# Patient Record
Sex: Female | Born: 1980 | Race: White | Hispanic: Yes | Marital: Married | Smoking: Current some day smoker
Health system: Southern US, Community
[De-identification: ages and names within clinical notes are randomized; demographics above are authoritative.]

---

## 2000-02-10 ENCOUNTER — Encounter (HOSPITAL_COMMUNITY): Admission: RE | Admit: 2000-02-10 | Discharge: 2000-02-27 | Payer: Self-pay | Admitting: *Deleted

## 2000-02-26 ENCOUNTER — Inpatient Hospital Stay (HOSPITAL_COMMUNITY): Admission: AD | Admit: 2000-02-26 | Discharge: 2000-02-28 | Payer: Self-pay | Admitting: *Deleted

## 2003-07-16 ENCOUNTER — Other Ambulatory Visit: Admission: RE | Admit: 2003-07-16 | Discharge: 2003-07-16 | Payer: Self-pay | Admitting: Dermatology

## 2005-02-05 ENCOUNTER — Ambulatory Visit: Payer: Self-pay | Admitting: Family Medicine

## 2005-02-10 ENCOUNTER — Ambulatory Visit: Payer: Self-pay | Admitting: *Deleted

## 2005-02-17 ENCOUNTER — Ambulatory Visit: Payer: Self-pay | Admitting: *Deleted

## 2005-02-24 ENCOUNTER — Ambulatory Visit: Payer: Self-pay | Admitting: *Deleted

## 2005-02-25 ENCOUNTER — Ambulatory Visit: Payer: Self-pay | Admitting: Certified Nurse Midwife

## 2005-02-25 ENCOUNTER — Inpatient Hospital Stay (HOSPITAL_COMMUNITY): Admission: AD | Admit: 2005-02-25 | Discharge: 2005-02-27 | Payer: Self-pay | Admitting: Obstetrics

## 2005-02-25 ENCOUNTER — Inpatient Hospital Stay (HOSPITAL_COMMUNITY): Admission: AD | Admit: 2005-02-25 | Discharge: 2005-02-25 | Payer: Self-pay | Admitting: *Deleted

## 2009-01-15 ENCOUNTER — Other Ambulatory Visit: Admission: RE | Admit: 2009-01-15 | Discharge: 2009-01-15 | Payer: Self-pay | Admitting: Unknown Physician Specialty

## 2009-01-15 ENCOUNTER — Encounter (INDEPENDENT_AMBULATORY_CARE_PROVIDER_SITE_OTHER): Payer: Self-pay | Admitting: Unknown Physician Specialty

## 2010-02-18 ENCOUNTER — Other Ambulatory Visit: Admission: RE | Admit: 2010-02-18 | Discharge: 2010-02-18 | Payer: Self-pay | Admitting: Unknown Physician Specialty

## 2011-01-27 ENCOUNTER — Other Ambulatory Visit: Payer: Self-pay | Admitting: Nurse Practitioner

## 2011-01-27 ENCOUNTER — Other Ambulatory Visit (HOSPITAL_COMMUNITY)
Admission: RE | Admit: 2011-01-27 | Discharge: 2011-01-27 | Disposition: A | Discharge status: Home / Self Care | Payer: Self-pay | Source: Ambulatory Visit | Attending: Unknown Physician Specialty | Admitting: Unknown Physician Specialty

## 2011-01-27 DIAGNOSIS — N871 Moderate cervical dysplasia: Secondary | ICD-10-CM | POA: Insufficient documentation

## 2013-03-17 ENCOUNTER — Other Ambulatory Visit: Payer: Self-pay | Admitting: Obstetrics & Gynecology

## 2013-03-17 DIAGNOSIS — O3680X0 Pregnancy with inconclusive fetal viability, not applicable or unspecified: Secondary | ICD-10-CM

## 2013-03-21 ENCOUNTER — Ambulatory Visit (INDEPENDENT_AMBULATORY_CARE_PROVIDER_SITE_OTHER): Payer: Self-pay

## 2013-03-21 DIAGNOSIS — O3680X0 Pregnancy with inconclusive fetal viability, not applicable or unspecified: Secondary | ICD-10-CM

## 2013-03-21 NOTE — Progress Notes (Signed)
Single, living, iup, meas. C/w dates.cx long and closed, bilat ovs seen, lo w/ c.l., ys nml size/shape, +FHT.

## 2013-03-28 ENCOUNTER — Other Ambulatory Visit (HOSPITAL_COMMUNITY)
Admission: RE | Admit: 2013-03-28 | Discharge: 2013-03-28 | Disposition: A | Discharge status: Home / Self Care | Payer: Self-pay | Source: Ambulatory Visit | Attending: Obstetrics & Gynecology | Admitting: Obstetrics & Gynecology

## 2013-03-28 ENCOUNTER — Ambulatory Visit (INDEPENDENT_AMBULATORY_CARE_PROVIDER_SITE_OTHER): Payer: Self-pay | Admitting: Women's Health

## 2013-03-28 ENCOUNTER — Encounter: Payer: Self-pay | Admitting: Women's Health

## 2013-03-28 VITALS — BP 102/64 | Ht 61.0 in | Wt 152.2 lb

## 2013-03-28 DIAGNOSIS — Z01419 Encounter for gynecological examination (general) (routine) without abnormal findings: Secondary | ICD-10-CM | POA: Insufficient documentation

## 2013-03-28 DIAGNOSIS — O34219 Maternal care for unspecified type scar from previous cesarean delivery: Secondary | ICD-10-CM

## 2013-03-28 DIAGNOSIS — Z331 Pregnant state, incidental: Secondary | ICD-10-CM

## 2013-03-28 DIAGNOSIS — Z3401 Encounter for supervision of normal first pregnancy, first trimester: Secondary | ICD-10-CM

## 2013-03-28 DIAGNOSIS — Z98891 History of uterine scar from previous surgery: Secondary | ICD-10-CM | POA: Insufficient documentation

## 2013-03-28 DIAGNOSIS — Z1389 Encounter for screening for other disorder: Secondary | ICD-10-CM

## 2013-03-28 DIAGNOSIS — Z113 Encounter for screening for infections with a predominantly sexual mode of transmission: Secondary | ICD-10-CM | POA: Insufficient documentation

## 2013-03-28 DIAGNOSIS — Z1151 Encounter for screening for human papillomavirus (HPV): Secondary | ICD-10-CM | POA: Insufficient documentation

## 2013-03-28 DIAGNOSIS — Z3480 Encounter for supervision of other normal pregnancy, unspecified trimester: Secondary | ICD-10-CM | POA: Insufficient documentation

## 2013-03-28 DIAGNOSIS — O3440 Maternal care for other abnormalities of cervix, unspecified trimester: Secondary | ICD-10-CM

## 2013-03-28 LAB — POCT URINALYSIS DIPSTICK
Glucose, UA: NEGATIVE
Leukocytes, UA: NEGATIVE
Protein, UA: NEGATIVE

## 2013-03-28 LAB — CBC
Hemoglobin: 12.6 g/dL (ref 12.0–15.0)
MCH: 32.6 pg (ref 26.0–34.0)
Platelets: 350 10*3/uL (ref 150–400)
RBC: 3.86 MIL/uL — ABNORMAL LOW (ref 3.87–5.11)
WBC: 10.8 10*3/uL — ABNORMAL HIGH (ref 4.0–10.5)

## 2013-03-28 NOTE — Patient Instructions (Addendum)
Embarazo  Primer trimestre  (Pregnancy - First Trimester)  Durante el acto sexual, millones de espermatozoides entran en la vagina. Slo 1 espermatozoide penetra y fertiliza al vulo mientras se encuentra en la trompa de [place]. Una semana ms tarde, el vulo fertilizado se implanta en la pared del tero. Un embrin comienza a desarrollarse para ser un beb. A las 6 a 8 semanas se forman los ojos y la cara y los latidos del corazn se pueden ver en la ecografa. Al final de las 12 semanas (primer trimestre) todos los rganos del beb estn formados. Ahora que est embarazada, querr hacer todo lo que est a su alcance para tener un beb sano. Dos de las cosas ms importantes son: tener una buena atencin prenatal y seguir las indicaciones del profesional que la asiste. La atencin prenatal incluye toda la asistencia mdica que usted recibe antes del nacimiento del beb. Se lleva a cabo para prevenir y tratar problemas durante el embarazo y el parto. EXAMENES PRENATALES   Durante las visitas prenatales se controlan el peso, la presin arterial y se solicitan anlisis de orina. Esto se hace para asegurarse de que usted est sana y el embarazo progrese normalmente.  Una mujer embarazada debe aumentar de 25 a 35 libras durante el embarazo. Sin embargo, si usted tiene sobrepeso o bajo peso, su mdico le aconsejar qu hacer.  El podr hacerle preguntas y responder todas las que usted le haga.  Durante los exmenes prenatales se solicitan anlisis de sangre, cultivos del tero, un [place] y otros anlisis necesarios. Estas pruebas se realizan para controlar su salud y la del beb. Se recomienda que se haga la prueba para el diagnstico del VIH, con su autorizacin. Este es el virus que causa el SIDA. Estas pruebas se realizan porque existen medicamentos que podran administrarle para prevenir que el beb nazca con esta infeccin, si usted estuviera infectada y no lo supiera. Los anlisis de sangre  tambin se realizan para determinar el tipo de sangre, si tuvo infecciones previas y controlar sus niveles en la sangre (hemoglobina).  Tener un recuento bajo de hemoglobina (anemia) es comn durante el embarazo. Para prevenirla, se administran hierro y vitaminas. En una etapa ms avanzada del embarazo, le indicarn exmenes de sangre para saber si tiene diabetes, junto con otros anlisis, en caso de que tuviera problemas.  Es necesario que se haga las pruebas para asegurarse de que usted y el beb estn bien. CAMBIOS DURANTE EL PRIMER TRIMESTRE  Su organismo atravesar numerosos cambios durante el embarazo. Estos pueden variar de una persona a otra. Converse con el mdico acerca los cambios que usted nota y que la preocupan. Ellos son:   El perodo menstrual se detiene.  El vulo y los espermatozoides llevan los genes que determinan cmo seremos. Sus genes y los de su pareja forman el beb. Los genes del varn determinan si ser un nio o una nia.  La circunferencia de la cintura va a ir aumentando y podr sentirse hinchada.  Puede tener malestar estomacal (nuseas) y vmitos. Si no puede controlar los vmitos, consulte a su mdico.  Sus mamas comenzarn a agrandarse y sensibilizarse.  Los pezones pueden sobresalir ms y ser ms oscuros.  Tendr necesidad de orinar ms. El dolor al orinar puede significar que usted tiene una infeccin de la vejiga.  Se cansar con facilidad.  Prdida del apetito.  Sentir un fuerte deseo de consumir ciertos alimentos.  Al principio, usted puede ganar o perder un par de kilos.    Podr tener cambios emocionales de un da a otro (entusiasmo por estar embarazada o preocupacin por el embarazo y el beb).  Tendr sueos ms vvidos y extraos. INSTRUCCIONES PARA EL CUIDADO EN EL HOGAR   Es muy importante evitar el cigarrillo, el alcohol y los frmacos no recetados durante el embarazo. Estas sustancias afectan la formacin y el desarrollo del beb.  Evite los productos qumicos durante el embarazo para asegurar la salud del beb.  Comience las consultas prenatales alrededor de la 12 semana de embarazo. Generalmente se programan cada mes al principio y se hacen ms frecuentes en los 2 ltimos meses antes del parto. Cumpla con las citas de control. Siga las indicaciones del mdico con respecto al uso de medicamentos, los anlisis y pruebas de laboratorio, los ejercicios y la dieta.  Durante el embarazo debe obtener nutrientes para usted y para su beb. Consuma alimentos balanceados. Elija alimentos como carne, pescado, leche y otros productos lcteos descremados, vegetales, frutas, panes integrales y cereales. El mdico le informar cul es el aumento de peso ideal.  Las nuseas matinales pueden aliviarse si come algunas galletitas saladas en la cama. Coma dos galletitas antes de levantarse por la maana. Tambin puede comer galletitas sin sal.  Hacer 4 o 5 comidas pequeas en lugar de 3 comidas grandes por da tambin puede aliviar las nuseas y los vmitos.  Beber lquidos entre las comidas en lugar de tomarlos durante las comidas tambin puede ayudar a calmar las nuseas y los vmitos.  Puede continuar teniendo relaciones sexuales durante todo el embarazo si no hay otros problemas. Los problemas pueden ser una prdida precoz (prematura) de lquido amnitico, sangrado vaginal, o dolor en el vientre (abdominal).  Realice actividad fsica todos los das, si no tiene restricciones. Consulte con su mdico o terapeuta fsico si no est segura de algunos de sus ejercicios. El mayor aumento de peso se producir en los ltimos 2 trimestres del embarazo. El ejercicio le ayudar a:  Controlar su peso.  Mantenerse en forma.  Prepararse para el parto.  La ayudar a perder el peso del embarazo despus de que nazca su beb.  Use un buen sostn o como los que se usan para hacer deportes para aliviar la sensibilidad de las mamas. Tambin puede serle  til si lo usa mientras duerme.  Consulte cuando puede comenzar con las clases de pre parto. Comience con las clases cuando estn disponibles.  No utilice la baera con agua caliente, baos turcos y saunas.   Colquese el cinturn de seguridad cuando conduzca. Este la proteger a usted y al beb en caso de accidente.  Evite comer carne cruda y el contacto con los utensilios y desperdicios de los gatos. Estos elementos contienen grmenes que pueden causar defectos de nacimiento en el beb.  El primer trimestre es un buen momento para visitar a su dentista y evaluar su salud dental. Es importante mantener los dientes limpios. Use un cepillo de dientes suave y cepllese con ms suavidad durante el embarazo.  Pida ayuda si tienen necesidades financieras, teraputicas o nutricionales. El profesional podr ayudarla con respecto a estas necesidades, o derivarla a otros especialistas.  No tome medicamentos o hierbas excepto aquellos que le indic el profesional.  Informe a su mdico si sufre violencia familiar mental o fsica.  Haga una lista de nmeros de telfono de emergencia de la familia, los amigos, el hospital y los departamentos de polica y bomberos.  Escriba sus preguntas. Llvelas cuando concurra a su visita prenatal.  No se   haga duchas vaginales.  No cruce las piernas.  Si usted tiene que estar parada por largos perodos de tiempo, gire los pies o de pequeos pasos en crculo.  Es posible que tenga ms secreciones vaginales que puedan requerir una toalla higinica. No use tampones o toallas higinicas perfumadas. EL CONSUMO DE MEDICAMENTOS Y FRMACOS DURANTE EL EMBARAZO   Tome las vitaminas para la etapa prenatal tal como se le indic. Las vitaminas deben contener un miligramo de cido flico. Guarde todas las vitaminas fuera del alcance de los nios. La ingestin de slo un par de vitaminas o tabletas que contengan hierro pueden ocasionar la muerte en un beb o en un nio  pequeo.  Evite el uso de todos los medicamentos, incluyendo hierbas, medicamentos de venta libre, sin receta o que no hayan sido sugeridos por su mdico. Slo tome medicamentos de venta libre o medicamentos recetados para el dolor, el malestar o fiebre como lo indique su mdico. No tome aspirina, ibuprofeno o naproxeno excepto que su mdico se lo indique.  Infrmele al profesional si consume medicamentos de hierbas.  El alcohol se relaciona con ciertos defectos congnitos. Incluye el sndrome de alcoholismo fetal. Debe evitar absolutamente el consumo de alcohol, en cualquier forma. El fumar causar baja tasa de natalidad y bebs prematuros.  Las drogas ilegales o de la calle son muy perjudiciales para el beb. Estn absolutamente prohibidas. Un beb que nace de una madre adicta, ser adicto al nacer. Ese beb tendr los mismos sntomas de abstinencia que un adulto.  Informe a su mdico acerca de los medicamentos que ha tomado y el motivo por el que los tom. SOLICITE ATENCIN MDICA SI:  Tiene preguntas o preocupaciones relacionadas con el embarazo. Es mejor que llame para formular las preguntas si no puede esperar hasta la prxima visita, que sentirse preocupada por ellas.  SOLICITE ATENCIN MDICA DE INMEDIATO SI:   La temperatura oral le sube a ms de 102 F (38.9 C) o lo que su mdico le indique.  Tiene una prdida de lquido por la vagina (canal de parto). Si sospecha una ruptura de las membranas, tmese la temperatura y llame al profesional para informarlo sobre esto.  Observa unas pequeas manchas o una hemorragia vaginal. Notifique al profesional acerca de la cantidad y de cuntos apsitos est utilizando.  Presenta un olor desagradable en la secrecin vaginal y observa un cambio en el color.  Contina con las nuseas y no obtiene alivio de los remedios indicados. Vomita sangre o algo similar a la borra del caf.  Pierde ms de 2 libras (1 Kg) en una semana.  Aumenta ms de 1 Kg  en una semana y nota el rostro, las manos, los pies o las piernas hinchados.  Aumenta ms de 2,5 Kg en una semana (aunque no tenga las manos, pies, piernas o el rostro hinchados).  Ha estado expuesta a la rubola y no ha sufrido la enfermedad.  Ha estado expuesta a la quinta enfermedad o a la varicela.  Siente dolor en el vientre (abdominal). Las molestias en el ligamento redondo son una causa benigna frecuente de dolor abdominal durante el embarazo. El profesional que la asiste deber evaluarla.  Presenta dolor de cabeza, fiebre, diarrea, dolor al orinar o le falta la respiracin.  Se cae, se ve involucrada en un accidente automovilstico o sufre algn tipo de traumatismo.  En su hogar hay violencia mental o fsica. Document Released: 06/03/2005 Document Revised: 05/18/2012 [employer] Patient Information 2014 [employer].  

## 2013-03-28 NOTE — Progress Notes (Signed)
New OB packet given. Consents signed. White vaginal discharge. JSY

## 2013-03-28 NOTE — Progress Notes (Signed)
Subjective:    April Aguirre is a 32 y.o. [phone] Hispanic female at [redacted]w[redacted]d by LMP which correlates well w/ 9.3wk u/s, being seen today for her first obstetrical visit.  Her obstetrical history is significant for c/s w/ first baby d/t macrosomia (9lb), w/ 3 subsequent spontaneous VBACs. Desires another VBAC. Strong family h/o DM. Pregnancy history fully reviewed. H/o abnormal pap w/ LEEP procedure in 2012, per pt supposed to be getting q 6 month paps. Last pap in ~Oct/Nov 2013.  Spanish-speaking only. Pacific interpreters used for communication.  Patient reports mild nausea- declines antiemetics. Denies cramping, vb, lof, uti s/s.Ceasar Mons Vitals:   03/28/13 1104 03/28/13 1201  BP: 102/64   Height:  5\' 1"  (1.549 m)  Weight: 152 lb 3.2 oz (69.037 kg)     HISTORY: OB History   Grav Para Term Preterm Abortions TAB SAB Ect Mult Living   6 4 4  1  1   4      # Outc Date GA Lbr Len/2nd Wgt Sex Del Anes PTL Lv   1 TRM 3/96 [redacted]w[redacted]d  9lb(4.082kg) M LTCS   Yes   2 TRM 11/97 [redacted]w[redacted]d  7lb(3.175kg) M VBAC   Yes   3 SAB 1999           4 TRM 6/01 [redacted]w[redacted]d  7lb(3.175kg) F VBAC   Yes   5 TRM 6/06 [redacted]w[redacted]d  7lb(3.175kg) F VBAC   Yes   6 CUR              History reviewed. No pertinent past medical history. Past Surgical History  Procedure Laterality Date  . Cesarean section     Family History  Problem Relation Age of Onset  . Diabetes Mother   . Diabetes Brother   . Diabetes Brother   . Diabetes Brother      Exam    Pelvic Exam:    Perineum: Normal Perineum   Vulva: normal   Vagina:  normal mucosa, normal discharge, no palpable nodules   Uterus   normal size, shape/contour for 10wks     Cervix: normal   Adnexa: Not palpable   Urinary: urethral meatus normal    System:     Skin: normal coloration and turgor, no rashes    Neurologic: oriented, normal mood   Extremities: normal strength, tone, and muscle mass   HEENT PERRLA   Mouth/Teeth mucous membranes moist   Cardiovascular: regular rate and rhythm   Respiratory:  appears well, vitals normal, no respiratory distress, acyanotic, normal RR   Abdomen: soft, non-tender    Thin prep pap smear obtained w/ high risk HPV screening  FHR 140s via doppler  Assessment:    Pregnancy: [id number] Patient Active Problem List   Diagnosis Date Noted  . Supervision of other normal pregnancy 03/28/2013    Priority: High  . H/O LEEP (loop electrosurgical excision procedure) of cervix complicating pregnancy 03/28/2013  . Previous cesarean section 03/28/2013      [redacted]w[redacted]d [id number] New OB visit H/O previous CS w/ 3 subsequent VBACs H/O abnormal pap w/ LEEP H/O macrosomia Strong family h/o DM  Plan:     Initial labs drawn- pt w/o insurance, not applying for medicaid- aware she will get a bill, can call company for discounted rate and payment plan Continue prenatal vitamins Problem list reviewed and updated Reviewed n/v relief measures and warning s/s to report Reviewed recommended weight gain based on pre-gravid BMI Encouraged well-balanced diet Genetic Screening discussed Integrated Screen: declined Cystic fibrosis screening  discussed declined Ultrasound discussed; fetal survey: requested Follow up in 4 weeks for early 2hr gtt d/t h/o macrosomia, hispanic, age >23yo, strong family h/o DM  Marge Duncans 03/28/2013 1:38 PM

## 2013-03-29 LAB — URINALYSIS
Bilirubin Urine: NEGATIVE
Glucose, UA: NEGATIVE mg/dL
Leukocytes, UA: NEGATIVE
Specific Gravity, Urine: 1.019 (ref 1.005–1.030)
Urobilinogen, UA: 0.2 mg/dL (ref 0.0–1.0)

## 2013-03-29 LAB — DRUG SCREEN, URINE, NO CONFIRMATION
Amphetamine Screen, Ur: NEGATIVE
Barbiturate Quant, Ur: NEGATIVE
Cocaine Metabolites: NEGATIVE
Creatinine,U: 222 mg/dL
Phencyclidine (PCP): NEGATIVE

## 2013-03-29 LAB — ABO AND RH

## 2013-03-29 LAB — RPR

## 2013-03-29 LAB — HIV ANTIBODY (ROUTINE TESTING W REFLEX): HIV: NONREACTIVE

## 2013-03-29 LAB — HEPATITIS B SURFACE ANTIGEN: Hepatitis B Surface Ag: NEGATIVE

## 2013-03-29 LAB — OXYCODONE SCREEN, UA, RFLX CONFIRM: Oxycodone Screen, Ur: NEGATIVE ng/mL

## 2013-03-30 LAB — URINE CULTURE
Colony Count: NO GROWTH
Organism ID, Bacteria: NO GROWTH

## 2013-04-01 ENCOUNTER — Encounter: Payer: Self-pay | Admitting: Women's Health

## 2013-04-25 ENCOUNTER — Ambulatory Visit (INDEPENDENT_AMBULATORY_CARE_PROVIDER_SITE_OTHER): Payer: Self-pay | Admitting: Women's Health

## 2013-04-25 ENCOUNTER — Encounter: Payer: Self-pay | Admitting: Obstetrics and Gynecology

## 2013-04-25 ENCOUNTER — Other Ambulatory Visit: Payer: Self-pay

## 2013-04-25 VITALS — BP 98/58 | Wt 153.4 lb

## 2013-04-25 DIAGNOSIS — O3440 Maternal care for other abnormalities of cervix, unspecified trimester: Secondary | ICD-10-CM

## 2013-04-25 DIAGNOSIS — O2390 Unspecified genitourinary tract infection in pregnancy, unspecified trimester: Secondary | ICD-10-CM

## 2013-04-25 DIAGNOSIS — Z3482 Encounter for supervision of other normal pregnancy, second trimester: Secondary | ICD-10-CM

## 2013-04-25 DIAGNOSIS — Z833 Family history of diabetes mellitus: Secondary | ICD-10-CM

## 2013-04-25 DIAGNOSIS — O2342 Unspecified infection of urinary tract in pregnancy, second trimester: Secondary | ICD-10-CM

## 2013-04-25 DIAGNOSIS — O34219 Maternal care for unspecified type scar from previous cesarean delivery: Secondary | ICD-10-CM

## 2013-04-25 DIAGNOSIS — Z331 Pregnant state, incidental: Secondary | ICD-10-CM

## 2013-04-25 DIAGNOSIS — Z1389 Encounter for screening for other disorder: Secondary | ICD-10-CM

## 2013-04-25 LAB — POCT URINALYSIS DIPSTICK
Blood, UA: 1
Nitrite, UA: POSITIVE

## 2013-04-25 MED ORDER — NITROFURANTOIN MONOHYD MACRO 100 MG PO CAPS: 100.0000 mg | ORAL_CAPSULE | Freq: Two times a day (BID) | ORAL | Status: DC

## 2013-04-25 MED ORDER — PRENATAL PLUS 27-1 MG PO TABS: 1.0000 | ORAL_TABLET | Freq: Every day | ORAL | Status: DC

## 2013-04-25 NOTE — Patient Instructions (Signed)
Infeccin urinaria en el embarazo  (Urinary Tract Infection in Pregnancy) Una infeccin urinaria (IU) puede ocurrir en Corporate treasurer del tracto urinario. La infeccin urinaria puede [employer] utteres, los riones (pielonefritis), la vejiga (cistitis) y Engineer, mining (uretritis). Todas las mujeres embarazadas deben ser estudiadas para diagnosticar la presencia de bacterias en el tracto urinario. La identificacin y el tratamiento de una infeccin urinaria disminuye el riesgo de un parto prematuro y de Environmental education officer infecciones ms graves en la madre y el beb.  CAUSAS  Las bacterias causan casi todas las infecciones urinarias. Hay muchos factores que pueden aumentar sus probabilidades de contraer una infeccin urinaria durante el [place]. Ellos son:   Ericka Pontiff corta.  Falta de aseo y malos hbitos de higiene.  Las [employer].  Obstruccin de la orina en el tracto urinario.  Problemas con los msculos o nervios plvicos.  Diabetes.  Obesidad.  Problemas en la vejiga despus de tener varios hijos.  Antecedentes de infeccin urinaria. SNTOMAS   Dolor, ardor o sensacin de ardor al [employer].  Sentir la necesidad de Geographical information systems officer de inmediato [place]).  Prdida del control vesical (incontinencia urinaria).  Orinar con ms frecuencia de lo comn en el embarazo.  Malestar en la zona inferior del abdomen o en la espalda.  Mason Jim turbia.  Sangre en la orina (hematuria).  Grant Ruts. Cuando se infectan los riones, los sntomas pueden ser:   Dolor de espalda.  Dolor lateral en el lado derecho ms que el lado izquierdo.  Grant Ruts.  Escalofros.  Nuseas.  Vmitos. DIAGNSTICO   Anlisis de [place].  Los estudios o procedimientos adicionales pueden ser:  Denice Paradise de los riones, los urteres, la vejiga y Engineer, mining.  Observar la vejiga con un tubo que ilumina (cistoscopia).  Ciertos estudios radiogrficos slo cuando sea absolutamente necesario. Averige los  [employer] [employer] de sus anlisis Consulte la fecha en que los resultados estarn disponibles. Asegrese de [employer].  TRATAMIENTO   Antibiticos por va oral.  Antibiticos por la vena (va intravenosa), si es necesario. INSTRUCCIONES PARA EL CUIDADO EN EL HOGAR   Tome los antibiticos como se le indic. Tmelos todos, aunque se sienta mejor. Tome slo la medicacin que le indic el profesional.  Debe ingerir una gran cantidad de lquido para mantener la orina de tono claro o color amarillo plido.  No tenga relaciones sexuales hasta que la infeccin haya desaparecido o el mdico la autorice.  Asegrese de que le hagan estudios para Engineer, manufacturing una infeccin urinaria durante el embarazo si est embarazada. Estas infecciones suelen reaparecer. Para prevenir una infeccin urinaria en el futuro:  Practique buenos hbitos higinicos. Siempre debe limpiarse desde adelante hacia atrs. Use el tissue slo una vez.  No retenga la orina. Orine tan pronto como sea posible cuando tenga ganas.  No se haga duchas vaginales ni use desodorantes en aerosol.  Lave con agua tibia y jabn alrededor de la zona genital y el ano.  Vace la vejiga antes y despus de Management consultant.  Use ropa interior con algodn en la entrepierna.  Evite la cafena y las [address]. Estas sustancias irritan la vejiga.  Beba jugo de arndanos o tome comprimidos de arndano. Esto puede disminuir el riesgo de sufrir una infeccin urinaria.  No beba alcohol.  Cumpla con las visitas de control y hgase todos los anlisis segn lo programado. SOLICITE ATENCIN MDICA SI:   Los sntomas empeoran.  Tiene fiebre an despus de [address]  [address] de [address].  Aparece una erupcin cutnea.  Siente que usted tiene problemas con los medicamentos recetados.  Brett Fairy hemorragia vaginal anormal. SOLICITE ATENCIN MDICA DE INMEDIATO SI:   Siente dolor en la espalda o en un  flanco.  Comienza a sentir escalofros.  Observa sangre en la orina.  Presenta nuseas o vmitos.  Siente contracciones en el tero.  Tiene una perdida repentina de lquido por la vagina. ASEGRESE DE QUE:   Comprende estas instrucciones.  Controlar su enfermedad.  Solicitar ayuda de inmediato si no mejora o si empeora. Document Released: 05/18/2012 Document Revised: 08/10/2012 [facility] Patient Information 2014 [place], [place].

## 2013-04-25 NOTE — Progress Notes (Signed)
No complaints at this time. Positive nitrates in urine.

## 2013-04-25 NOTE — Progress Notes (Signed)
Early 2hr gtt today d/t h/o LGA and strong family h/o DM. Denies uc's, lof, vb, urinary frequency, urgency, hesitancy, or dysuria.  No complaints.  Reviewed warning s/s to report.  All questions answered. F/U in 4wks for anatomy u/s and visit.

## 2013-04-26 LAB — GLUCOSE TOLERANCE, 2 HOURS W/ 1HR
Glucose, 1 hour: 139 mg/dL (ref 70–170)
Glucose, 2 hour: 137 mg/dL (ref 70–139)
Glucose, Fasting: 76 mg/dL (ref 70–99)

## 2013-05-23 ENCOUNTER — Ambulatory Visit (INDEPENDENT_AMBULATORY_CARE_PROVIDER_SITE_OTHER): Payer: Self-pay

## 2013-05-23 ENCOUNTER — Ambulatory Visit (INDEPENDENT_AMBULATORY_CARE_PROVIDER_SITE_OTHER): Payer: Self-pay | Admitting: Obstetrics & Gynecology

## 2013-05-23 ENCOUNTER — Encounter: Payer: Self-pay | Admitting: Women's Health

## 2013-05-23 VITALS — BP 100/68 | Wt 155.5 lb

## 2013-05-23 DIAGNOSIS — O34219 Maternal care for unspecified type scar from previous cesarean delivery: Secondary | ICD-10-CM

## 2013-05-23 DIAGNOSIS — O3440 Maternal care for other abnormalities of cervix, unspecified trimester: Secondary | ICD-10-CM

## 2013-05-23 DIAGNOSIS — Z3482 Encounter for supervision of other normal pregnancy, second trimester: Secondary | ICD-10-CM

## 2013-05-23 DIAGNOSIS — Z331 Pregnant state, incidental: Secondary | ICD-10-CM | POA: Insufficient documentation

## 2013-05-23 DIAGNOSIS — Z1389 Encounter for screening for other disorder: Secondary | ICD-10-CM

## 2013-05-23 LAB — POCT URINALYSIS DIPSTICK
Ketones, UA: NEGATIVE
Protein, UA: NEGATIVE

## 2013-05-23 NOTE — Progress Notes (Signed)
BP weight and urine results all reviewed and noted. Patient reports good fetal movement, denies any bleeding and no rupture of membranes symptoms or regular contractions. Patient is without complaints. All questions were answered.  

## 2013-05-23 NOTE — Progress Notes (Signed)
No complaints at this time. Ultrasound today.

## 2013-05-23 NOTE — Progress Notes (Signed)
U/S(18+3wks)- active fetus, meas c/w dates, fluid wnl, ant gr 0 plac, cx long and closed, bilateral adnexa wnl, no major abnl noted, female fetus

## 2013-06-20 ENCOUNTER — Encounter: Payer: Self-pay | Admitting: Women's Health

## 2013-07-12 ENCOUNTER — Encounter (INDEPENDENT_AMBULATORY_CARE_PROVIDER_SITE_OTHER): Payer: Self-pay

## 2013-07-12 ENCOUNTER — Encounter: Payer: Self-pay | Admitting: Obstetrics and Gynecology

## 2013-07-12 ENCOUNTER — Ambulatory Visit (INDEPENDENT_AMBULATORY_CARE_PROVIDER_SITE_OTHER): Payer: Self-pay | Admitting: Obstetrics and Gynecology

## 2013-07-12 VITALS — BP 100/60 | Wt 162.0 lb

## 2013-07-12 DIAGNOSIS — O34219 Maternal care for unspecified type scar from previous cesarean delivery: Secondary | ICD-10-CM

## 2013-07-12 DIAGNOSIS — Z3490 Encounter for supervision of normal pregnancy, unspecified, unspecified trimester: Secondary | ICD-10-CM | POA: Insufficient documentation

## 2013-07-12 DIAGNOSIS — O09299 Supervision of pregnancy with other poor reproductive or obstetric history, unspecified trimester: Secondary | ICD-10-CM

## 2013-07-12 DIAGNOSIS — O3440 Maternal care for other abnormalities of cervix, unspecified trimester: Secondary | ICD-10-CM

## 2013-07-12 DIAGNOSIS — Z3493 Encounter for supervision of normal pregnancy, unspecified, third trimester: Secondary | ICD-10-CM

## 2013-07-12 NOTE — Patient Instructions (Signed)
Diabetes testing at next visit

## 2013-07-12 NOTE — Progress Notes (Signed)
GOOD  FM, no c/o. PN-2 labs in 4 wk

## 2013-08-09 ENCOUNTER — Encounter: Payer: Self-pay | Admitting: Obstetrics and Gynecology

## 2013-08-09 ENCOUNTER — Other Ambulatory Visit: Payer: Self-pay

## 2013-08-09 ENCOUNTER — Ambulatory Visit (INDEPENDENT_AMBULATORY_CARE_PROVIDER_SITE_OTHER): Payer: Self-pay | Admitting: Obstetrics and Gynecology

## 2013-08-09 VITALS — BP 90/60 | Wt 164.0 lb

## 2013-08-09 DIAGNOSIS — O09299 Supervision of pregnancy with other poor reproductive or obstetric history, unspecified trimester: Secondary | ICD-10-CM

## 2013-08-09 DIAGNOSIS — Z331 Pregnant state, incidental: Secondary | ICD-10-CM

## 2013-08-09 DIAGNOSIS — O34219 Maternal care for unspecified type scar from previous cesarean delivery: Secondary | ICD-10-CM

## 2013-08-09 DIAGNOSIS — Z1389 Encounter for screening for other disorder: Secondary | ICD-10-CM

## 2013-08-09 DIAGNOSIS — O3440 Maternal care for other abnormalities of cervix, unspecified trimester: Secondary | ICD-10-CM

## 2013-08-09 NOTE — Addendum Note (Signed)
Addended by: Richardson Chiquito on: 08/09/2013 10:27 AM   Modules accepted: Orders

## 2013-08-09 NOTE — Progress Notes (Signed)
Good FM, no bleeding or sroml, slight d/c, felt wnl. Will schedule test for DM, will not test for HSV, has prior testing.

## 2013-08-09 NOTE — Progress Notes (Signed)
Pt here for routine visit. PT denies any problems or concerns at this time.

## 2013-08-11 ENCOUNTER — Encounter: Payer: Self-pay | Admitting: Obstetrics and Gynecology

## 2013-09-06 ENCOUNTER — Encounter: Payer: Self-pay | Admitting: Advanced Practice Midwife

## 2013-09-07 NOTE — L&D Delivery Note (Signed)
Delivery Note At 5:35 PM a viable female was delivered via Vaginal, Spontaneous Delivery (Presentation: Left Occiput Anterior).  APGAR: 8, 9; weight 9 lb 0.4 oz (4094 g).   Placenta status: Intact, Spontaneous.  Cord: 3 vessels with the following complications: None.    Anesthesia: None  Episiotomy: None Lacerations: 1st degree Suture Repair: 3.0 vicryl rapide Est. Blood Loss (mL): 350cc  Mom to postpartum.  Baby to Couplet care / Skin to Skin.  Pt progressed quickly to complete and pushed with good maternal effort to deliver a liveborn female newborn via VBAC.  Spontaneous cry.  Placenta delivered intact with 3V cord with traction and pitocin.  1st degree repaired for cosmesis as extended down the external perineum.  ebl 350cc.  No complications.  Baby to skin to skin and mom to postpartum.   Adarryl Goldammer L 10/28/2013, 6:03 PM

## 2013-09-14 ENCOUNTER — Encounter: Payer: Self-pay | Admitting: Advanced Practice Midwife

## 2013-09-18 ENCOUNTER — Encounter: Payer: Self-pay | Admitting: Adult Health

## 2013-09-19 ENCOUNTER — Encounter: Payer: Self-pay | Admitting: *Deleted

## 2013-09-19 ENCOUNTER — Encounter: Payer: Self-pay | Admitting: Women's Health

## 2013-09-26 ENCOUNTER — Ambulatory Visit (INDEPENDENT_AMBULATORY_CARE_PROVIDER_SITE_OTHER): Payer: Self-pay | Admitting: Obstetrics and Gynecology

## 2013-09-26 ENCOUNTER — Encounter: Payer: Self-pay | Admitting: Obstetrics and Gynecology

## 2013-09-26 VITALS — BP 122/70 | Wt 171.6 lb

## 2013-09-26 DIAGNOSIS — O34219 Maternal care for unspecified type scar from previous cesarean delivery: Secondary | ICD-10-CM

## 2013-09-26 DIAGNOSIS — Z1389 Encounter for screening for other disorder: Secondary | ICD-10-CM

## 2013-09-26 DIAGNOSIS — Z3480 Encounter for supervision of other normal pregnancy, unspecified trimester: Secondary | ICD-10-CM

## 2013-09-26 DIAGNOSIS — O3440 Maternal care for other abnormalities of cervix, unspecified trimester: Secondary | ICD-10-CM

## 2013-09-26 DIAGNOSIS — O09299 Supervision of pregnancy with other poor reproductive or obstetric history, unspecified trimester: Secondary | ICD-10-CM

## 2013-09-26 DIAGNOSIS — Z331 Pregnant state, incidental: Secondary | ICD-10-CM

## 2013-09-26 LAB — OB RESULTS CONSOLE GC/CHLAMYDIA
Chlamydia: NEGATIVE
Gonorrhea: NEGATIVE

## 2013-09-26 NOTE — Patient Instructions (Signed)
Evaluacin de los movimientos fetales  (Fetal Movement Counts) Nombre del paciente: __________________________________________________ Fecha de parto estimada: ____________________ La evaluacin de los movimientos fetales es muy recomendable en los embarazos de alto riesgo, pero tambin es una buena idea que lo hagan todas las embarazadas. El mdico le indicar que comience a contarlos a las 28 semanas de embarazo. Los movimientos fetales suelen aumentar:   Despus de una comida completa.  Despus de la actividad fsica.  Despus de comer o beber algo dulce o fro.  En reposo. Preste atencin cuando sienta que el beb est ms activo. Esto le ayudar a notar un patrn de ciclos de vigilia y sueo de su beb y cules son los factores que contribuyen a un aumento de los movimientos fetales. Es importante llevar a cabo un recuento de movimientos fetales, al mismo tiempo cada da, cuando el beb normalmente est ms activo.  CMO CONTAR LOS MOVIMIENTOS FETALES 1. Busque un lugar tranquilo y cmodo para sentarse o recostarse sobre el lado izquierdo. Al recostarse sobre su lado izquierdo, le proporciona una mejor circulacin de sangre y oxgeno al beb. 2. Anote el da y la hora en una hoja de papel o en un diario. 3. Comience contando las pataditas, revoloteos, chasquidos, vueltas o pinchazos en un perodo de 2 horas. Debe sentir al menos 10 movimientos en 2 horas. 4. Si no siente 10 movimientos en 2 horas, espere 2  3 horas y cuente de nuevo. Busque cambios en el patrn o si no cuenta lo suficiente en 2 horas. SOLICITE ATENCIN MDICA SI:   Siente menos de 10 pataditas en 2 horas, en dos intentos.  No hay movimientos durante una hora.  El patrn se modifica o le lleva ms tiempo cada da contar las 10 pataditas.  Siente que el beb no se mueve como lo hace habitualmente. Fecha: ____________ Movimientos: ____________ Hora de inicio: ____________ Hora de finalizacin: ____________  Fecha:  ____________ Movimientos: ____________ Hora de inicio: ____________ Hora de finalizacin: ____________  Fecha: ____________ Movimientos: ____________ Hora de inicio: ____________ Hora de finalizacin: ____________  Fecha: ____________ Movimientos: ____________ Hora de inicio: ____________ Hora de finalizacin: ____________  Fecha: ____________ Movimientos: ____________ Hora de inicio: ____________ Hora de finalizacin: ____________  Fecha: ____________ Movimientos: ____________ Hora de inicio: ____________ Hora de finalizacin: ____________  Fecha: ____________ Movimientos: ____________ Hora de inicio: ____________ Hora de finalizacin: ____________  Fecha: ____________ Movimientos: ____________ Hora de inicio: ____________ Hora de finalizacin: ____________  Fecha: ____________ Movimientos: ____________ Hora de inicio: ____________ Hora de finalizacin: ____________  Fecha: ____________ Movimientos: ____________ Hora de inicio: ____________ Hora de finalizacin: ____________  Fecha: ____________ Movimientos: ____________ Hora de inicio: ____________ Hora de finalizacin: ____________  Fecha: ____________ Movimientos: ____________ Hora de inicio: ____________ Hora de finalizacin: ____________  Fecha: ____________ Movimientos: ____________ Hora de inicio: ____________ Hora de finalizacin: ____________  Fecha: ____________ Movimientos: ____________ Hora de inicio: ____________ Hora de finalizacin: ____________  Fecha: ____________ Movimientos: ____________ Hora de inicio: ____________ Hora de finalizacin: ____________  Fecha: ____________ Movimientos: ____________ Hora de inicio: ____________ Hora de finalizacin: ____________  Fecha: ____________ Movimientos: ____________ Hora de inicio: ____________ Hora de finalizacin: ____________  Fecha: ____________ Movimientos: ____________ Hora de inicio: ____________ Hora de finalizacin: ____________  Fecha: ____________ Movimientos: ____________ Hora  de inicio: ____________ Hora de finalizacin: ____________  Fecha: ____________ Movimientos: ____________ Hora de inicio: ____________ Hora de finalizacin: ____________  Fecha: ____________ Movimientos: ____________ Hora de inicio: ____________ Hora de finalizacin: ____________  Fecha: ____________ Movimientos: ____________ Hora de inicio: ____________ Hora de   finalizacin: ____________  Fecha: ____________ Movimientos: ____________ Hora de inicio: ____________ Hora de finalizacin: ____________  Fecha: ____________ Movimientos: ____________ Hora de inicio: ____________ Hora de finalizacin: ____________  Fecha: ____________ Movimientos: ____________ Hora de inicio: ____________ Hora de finalizacin: ____________  Fecha: ____________ Movimientos: ____________ Hora de inicio: ____________ Hora de finalizacin: ____________  Fecha: ____________ Movimientos: ____________ Hora de inicio: ____________ Hora de finalizacin: ____________  Fecha: ____________ Movimientos: ____________ Hora de inicio: ____________ Hora de finalizacin: ____________  Fecha: ____________ Movimientos: ____________ Hora de inicio: ____________ Hora de finalizacin: ____________  Fecha: ____________ Movimientos: ____________ Hora de inicio: ____________ Hora de finalizacin: ____________  Fecha: ____________ Movimientos: ____________ Hora de inicio: ____________ Hora de finalizacin: ____________  Fecha: ____________ Movimientos: ____________ Hora de inicio: ____________ Hora de finalizacin: ____________  Fecha: ____________ Movimientos: ____________ Hora de inicio: ____________ Hora de finalizacin: ____________  Fecha: ____________ Movimientos: ____________ Hora de inicio: ____________ Hora de finalizacin: ____________  Fecha: ____________ Movimientos: ____________ Hora de inicio: ____________ Hora de finalizacin: ____________  Fecha: ____________ Movimientos: ____________ Hora de inicio: ____________ Hora de finalizacin:  ____________  Fecha: ____________ Movimientos: ____________ Hora de inicio: ____________ Hora de finalizacin: ____________  Fecha: ____________ Movimientos: ____________ Hora de inicio: ____________ Hora de finalizacin: ____________  Fecha: ____________ Movimientos: ____________ Hora de inicio: ____________ Hora de finalizacin: ____________  Fecha: ____________ Movimientos: ____________ Hora de inicio: ____________ Hora de finalizacin: ____________  Fecha: ____________ Movimientos: ____________ Hora de inicio: ____________ Hora de finalizacin: ____________  Fecha: ____________ Movimientos: ____________ Hora de inicio: ____________ Hora de finalizacin: ____________  Fecha: ____________ Movimientos: ____________ Hora de inicio: ____________ Hora de finalizacin: ____________  Fecha: ____________ Movimientos: ____________ Hora de inicio: ____________ Hora de finalizacin: ____________  Fecha: ____________ Movimientos: ____________ Hora de inicio: ____________ Hora de finalizacin: ____________  Fecha: ____________ Movimientos: ____________ Hora de inicio: ____________ Hora de finalizacin: ____________  Fecha: ____________ Movimientos: ____________ Hora de inicio: ____________ Hora de finalizacin: ____________  Fecha: ____________ Movimientos: ____________ Hora de inicio: ____________ Hora de finalizacin: ____________  Fecha: ____________ Movimientos: ____________ Hora de inicio: ____________ Hora de finalizacin: ____________  Fecha: ____________ Movimientos: ____________ Hora de inicio: ____________ Hora de finalizacin: ____________  Fecha: ____________ Movimientos: ____________ Hora de inicio: ____________ Hora de finalizacin: ____________  Fecha: ____________ Movimientos: ____________ Hora de inicio: ____________ Hora de finalizacin: ____________  Fecha: ____________ Movimientos: ____________ Hora de inicio: ____________ Hora de finalizacin: ____________  Fecha: ____________  Movimientos: ____________ Hora de inicio: ____________ Hora de finalizacin: ____________  Fecha: ____________ Movimientos: ____________ Hora de inicio: ____________ Hora de finalizacin: ____________  Fecha: ____________ Movimientos: ____________ Hora de inicio: ____________ Hora de finalizacin: ____________  Document Released: 12/01/2007 Document Revised: 08/10/2012 [employer] Patient Information 2014 [employer].  

## 2013-09-26 NOTE — Progress Notes (Signed)
Good FM, no bleeding or srom. Bc-DEPO.

## 2013-09-27 LAB — GC/CHLAMYDIA PROBE AMP
CT PROBE, AMP APTIMA: NEGATIVE
GC Probe RNA: NEGATIVE

## 2013-09-28 ENCOUNTER — Encounter: Payer: Self-pay | Admitting: Obstetrics and Gynecology

## 2013-09-28 LAB — STREP B DNA PROBE: STREP GROUP B AG: NEGATIVE

## 2013-10-11 ENCOUNTER — Encounter: Payer: Self-pay | Admitting: Obstetrics and Gynecology

## 2013-10-11 ENCOUNTER — Ambulatory Visit (INDEPENDENT_AMBULATORY_CARE_PROVIDER_SITE_OTHER): Payer: Self-pay

## 2013-10-11 ENCOUNTER — Ambulatory Visit (INDEPENDENT_AMBULATORY_CARE_PROVIDER_SITE_OTHER): Payer: Self-pay | Admitting: Obstetrics and Gynecology

## 2013-10-11 VITALS — BP 110/72 | Wt 176.0 lb

## 2013-10-11 DIAGNOSIS — Z331 Pregnant state, incidental: Secondary | ICD-10-CM

## 2013-10-11 DIAGNOSIS — O34219 Maternal care for unspecified type scar from previous cesarean delivery: Secondary | ICD-10-CM

## 2013-10-11 DIAGNOSIS — Z98891 History of uterine scar from previous surgery: Secondary | ICD-10-CM

## 2013-10-11 DIAGNOSIS — Z1389 Encounter for screening for other disorder: Secondary | ICD-10-CM

## 2013-10-11 DIAGNOSIS — Z9889 Other specified postprocedural states: Secondary | ICD-10-CM

## 2013-10-11 DIAGNOSIS — O3440 Maternal care for other abnormalities of cervix, unspecified trimester: Secondary | ICD-10-CM

## 2013-10-11 DIAGNOSIS — O09299 Supervision of pregnancy with other poor reproductive or obstetric history, unspecified trimester: Secondary | ICD-10-CM

## 2013-10-11 LAB — POCT URINALYSIS DIPSTICK
GLUCOSE UA: NEGATIVE
Glucose, UA: NEGATIVE
Glucose, UA: NEGATIVE
KETONES UA: NEGATIVE
Leukocytes, UA: NEGATIVE
Nitrite, UA: NEGATIVE
PROTEIN UA: 1
PROTEIN UA: NEGATIVE
Protein, UA: NEGATIVE

## 2013-10-11 NOTE — Progress Notes (Signed)
Pt denies any problems or concerns at this time.  

## 2013-10-11 NOTE — Progress Notes (Signed)
U/S(38+4wks)-vtx active fetus, fluid WNL , AFI- 11.8cm, anterior Gr 2 placenta, FHR-148 bpm, EFw 8 lb 7 oz (86th%tile), female fetus

## 2013-10-11 NOTE — Progress Notes (Signed)
  2920w4d. Good FM. + irregular pressure-like contractions. + ankle swelling. No bleeding or discharge. [place] due to baby being large, "4.5 kg". Has had vaginal births since first child without difficulty. Wishes to have a vaginal birth. Chaperone present for cervix check which was done with pt's permission. Cervix is closed, long and posterior. Last US done on 04/25/13. Will order another for EFW   US today shows fetus in 86% percentile, 8lbs 7oz. Pt informed in Spanish   This chart was scribed by Bennett Scrapehristina Taylor, Medical Scribe, for Dr. Christin BachJohn Chyanna Flock on 10/11/13 at 10:04 AM. This chart was reviewed by Dr. Christin BachJohn Alberto Pina and is accurate.

## 2013-10-20 ENCOUNTER — Ambulatory Visit (INDEPENDENT_AMBULATORY_CARE_PROVIDER_SITE_OTHER): Payer: Self-pay | Admitting: Obstetrics and Gynecology

## 2013-10-20 ENCOUNTER — Encounter: Payer: Self-pay | Admitting: Obstetrics and Gynecology

## 2013-10-20 VITALS — BP 122/80 | Wt 176.0 lb

## 2013-10-20 DIAGNOSIS — Z331 Pregnant state, incidental: Secondary | ICD-10-CM

## 2013-10-20 DIAGNOSIS — Z3490 Encounter for supervision of normal pregnancy, unspecified, unspecified trimester: Secondary | ICD-10-CM

## 2013-10-20 DIAGNOSIS — O09299 Supervision of pregnancy with other poor reproductive or obstetric history, unspecified trimester: Secondary | ICD-10-CM

## 2013-10-20 DIAGNOSIS — Z1389 Encounter for screening for other disorder: Secondary | ICD-10-CM

## 2013-10-20 DIAGNOSIS — Z9889 Other specified postprocedural states: Secondary | ICD-10-CM

## 2013-10-20 DIAGNOSIS — O34219 Maternal care for unspecified type scar from previous cesarean delivery: Secondary | ICD-10-CM

## 2013-10-20 DIAGNOSIS — O3440 Maternal care for other abnormalities of cervix, unspecified trimester: Secondary | ICD-10-CM

## 2013-10-20 DIAGNOSIS — Z3480 Encounter for supervision of other normal pregnancy, unspecified trimester: Secondary | ICD-10-CM

## 2013-10-20 LAB — POCT URINALYSIS DIPSTICK
Glucose, UA: NEGATIVE
Ketones, UA: NEGATIVE
Leukocytes, UA: NEGATIVE
Nitrite, UA: NEGATIVE

## 2013-10-20 NOTE — Progress Notes (Signed)
9068w6d. [id number]. Good FM. Reports a small amount of "brown" type spotting from vagina. Regular contractions. Chaperone present for exam. Exam performed with pt's permission. Discussed induction of labor next week. Pt's questions answered to apparent satisfaction.  This chart was scribed by Bennett Scrapehristina Taylor, Medical Scribe, for Dr. Christin BachJohn Eleanor Gatliff on 10/20/13 at 12:49 PM. This chart was reviewed by Dr. Christin BachJohn Lissett Favorite and is accurate.

## 2013-10-27 ENCOUNTER — Ambulatory Visit (INDEPENDENT_AMBULATORY_CARE_PROVIDER_SITE_OTHER): Payer: Self-pay | Admitting: Obstetrics & Gynecology

## 2013-10-27 ENCOUNTER — Encounter: Payer: Self-pay | Admitting: Obstetrics & Gynecology

## 2013-10-27 VITALS — BP 118/76 | Wt 178.0 lb

## 2013-10-27 DIAGNOSIS — O480 Post-term pregnancy: Secondary | ICD-10-CM

## 2013-10-27 DIAGNOSIS — O34219 Maternal care for unspecified type scar from previous cesarean delivery: Secondary | ICD-10-CM

## 2013-10-27 DIAGNOSIS — Z331 Pregnant state, incidental: Secondary | ICD-10-CM

## 2013-10-27 DIAGNOSIS — Z1389 Encounter for screening for other disorder: Secondary | ICD-10-CM

## 2013-10-27 DIAGNOSIS — O3440 Maternal care for other abnormalities of cervix, unspecified trimester: Secondary | ICD-10-CM

## 2013-10-27 DIAGNOSIS — O09299 Supervision of pregnancy with other poor reproductive or obstetric history, unspecified trimester: Secondary | ICD-10-CM

## 2013-10-27 LAB — POCT URINALYSIS DIPSTICK
Glucose, UA: NEGATIVE
KETONES UA: NEGATIVE
NITRITE UA: NEGATIVE
Protein, UA: NEGATIVE

## 2013-10-27 NOTE — Progress Notes (Signed)
Reactive NST  Scheduled for induction tomorrow 10/28/2013  BP weight and urine results all reviewed and noted. Patient reports good fetal movement, denies any bleeding and no rupture of membranes symptoms or regular contractions. Patient is without complaints. All questions were answered.

## 2013-10-28 ENCOUNTER — Inpatient Hospital Stay (HOSPITAL_COMMUNITY)
Admission: RE | Admit: 2013-10-28 | Discharge: 2013-10-30 | DRG: 775 | Disposition: A | Discharge status: Home / Self Care | Payer: Medicaid Other | Source: Ambulatory Visit | Attending: Obstetrics & Gynecology | Admitting: Obstetrics & Gynecology

## 2013-10-28 ENCOUNTER — Encounter (HOSPITAL_COMMUNITY): Payer: Self-pay

## 2013-10-28 VITALS — BP 109/63 | HR 74 | Temp 98.0°F | Resp 16 | Ht 62.0 in | Wt 177.0 lb

## 2013-10-28 DIAGNOSIS — O09529 Supervision of elderly multigravida, unspecified trimester: Secondary | ICD-10-CM | POA: Diagnosis present

## 2013-10-28 DIAGNOSIS — O34219 Maternal care for unspecified type scar from previous cesarean delivery: Secondary | ICD-10-CM

## 2013-10-28 DIAGNOSIS — Z3480 Encounter for supervision of other normal pregnancy, unspecified trimester: Secondary | ICD-10-CM

## 2013-10-28 DIAGNOSIS — Z3490 Encounter for supervision of normal pregnancy, unspecified, unspecified trimester: Secondary | ICD-10-CM

## 2013-10-28 DIAGNOSIS — O480 Post-term pregnancy: Secondary | ICD-10-CM

## 2013-10-28 LAB — TYPE AND SCREEN
ABO/RH(D): O POS
ANTIBODY SCREEN: NEGATIVE

## 2013-10-28 LAB — CBC
HEMATOCRIT: 34.9 % — AB (ref 36.0–46.0)
Hemoglobin: 12.2 g/dL (ref 12.0–15.0)
MCH: 33.9 pg (ref 26.0–34.0)
MCHC: 35 g/dL (ref 30.0–36.0)
MCV: 96.9 fL (ref 78.0–100.0)
Platelets: 275 10*3/uL (ref 150–400)
RBC: 3.6 MIL/uL — AB (ref 3.87–5.11)
RDW: 13.8 % (ref 11.5–15.5)
WBC: 11.1 10*3/uL — AB (ref 4.0–10.5)

## 2013-10-28 LAB — ABO/RH: ABO/RH(D): O POS

## 2013-10-28 LAB — RPR: RPR Ser Ql: NONREACTIVE

## 2013-10-28 MED ORDER — TERBUTALINE SULFATE 1 MG/ML IJ SOLN: 0.2500 mg | Freq: Once | INTRAMUSCULAR | Status: DC | PRN

## 2013-10-28 MED ORDER — LACTATED RINGERS IV SOLN: 500.0000 mL | Freq: Once | INTRAVENOUS | Status: DC

## 2013-10-28 MED ORDER — SENNOSIDES-DOCUSATE SODIUM 8.6-50 MG PO TABS
2.0000 | ORAL_TABLET | ORAL | Status: DC
  Administered 2013-10-28 – 2013-10-30 (×2): 2 via ORAL
  Filled 2013-10-28 (×2): qty 2

## 2013-10-28 MED ORDER — OXYTOCIN 40 UNITS IN LACTATED RINGERS INFUSION - SIMPLE MED
1.0000 m[IU]/min | INTRAVENOUS | Status: DC
  Administered 2013-10-28: 2 m[IU]/min via INTRAVENOUS
  Filled 2013-10-28: qty 1000

## 2013-10-28 MED ORDER — OXYTOCIN 40 UNITS IN LACTATED RINGERS INFUSION - SIMPLE MED
62.5000 mL/h | INTRAVENOUS | Status: DC
  Administered 2013-10-28: 62.5 mL/h via INTRAVENOUS

## 2013-10-28 MED ORDER — OXYCODONE-ACETAMINOPHEN 5-325 MG PO TABS: 1.0000 | ORAL_TABLET | ORAL | Status: DC | PRN

## 2013-10-28 MED ORDER — MEASLES, MUMPS & RUBELLA VAC ~~LOC~~ INJ
0.5000 mL | INJECTION | Freq: Once | SUBCUTANEOUS | Status: DC
  Filled 2013-10-28: qty 0.5

## 2013-10-28 MED ORDER — IBUPROFEN 600 MG PO TABS: 600.0000 mg | ORAL_TABLET | Freq: Four times a day (QID) | ORAL | Status: DC | PRN

## 2013-10-28 MED ORDER — IBUPROFEN 600 MG PO TABS
600.0000 mg | ORAL_TABLET | Freq: Four times a day (QID) | ORAL | Status: DC
  Administered 2013-10-28 – 2013-10-30 (×7): 600 mg via ORAL
  Filled 2013-10-28 (×7): qty 1

## 2013-10-28 MED ORDER — LACTATED RINGERS IV SOLN
INTRAVENOUS | Status: DC
  Administered 2013-10-28: 125 mL/h via INTRAVENOUS

## 2013-10-28 MED ORDER — OXYCODONE-ACETAMINOPHEN 5-325 MG PO TABS
1.0000 | ORAL_TABLET | ORAL | Status: DC | PRN
  Administered 2013-10-28: 2 via ORAL
  Filled 2013-10-28: qty 2

## 2013-10-28 MED ORDER — OXYTOCIN BOLUS FROM INFUSION
500.0000 mL | INTRAVENOUS | Status: DC
  Administered 2013-10-28: 500 mL via INTRAVENOUS

## 2013-10-28 MED ORDER — PHENYLEPHRINE 40 MCG/ML (10ML) SYRINGE FOR IV PUSH (FOR BLOOD PRESSURE SUPPORT)
80.0000 ug | PREFILLED_SYRINGE | INTRAVENOUS | Status: DC | PRN
  Filled 2013-10-28: qty 2

## 2013-10-28 MED ORDER — DIPHENHYDRAMINE HCL 25 MG PO CAPS: 25.0000 mg | ORAL_CAPSULE | Freq: Four times a day (QID) | ORAL | Status: DC | PRN

## 2013-10-28 MED ORDER — WITCH HAZEL-GLYCERIN EX PADS: 1.0000 "application " | MEDICATED_PAD | CUTANEOUS | Status: DC | PRN

## 2013-10-28 MED ORDER — ONDANSETRON HCL 4 MG/2ML IJ SOLN: 4.0000 mg | INTRAMUSCULAR | Status: DC | PRN

## 2013-10-28 MED ORDER — FENTANYL CITRATE 0.05 MG/ML IJ SOLN
100.0000 ug | INTRAMUSCULAR | Status: DC | PRN
  Administered 2013-10-28: 100 ug via INTRAVENOUS
  Filled 2013-10-28 (×2): qty 2

## 2013-10-28 MED ORDER — ONDANSETRON HCL 4 MG/2ML IJ SOLN: 4.0000 mg | Freq: Four times a day (QID) | INTRAMUSCULAR | Status: DC | PRN

## 2013-10-28 MED ORDER — ONDANSETRON HCL 4 MG PO TABS: 4.0000 mg | ORAL_TABLET | ORAL | Status: DC | PRN

## 2013-10-28 MED ORDER — EPHEDRINE 5 MG/ML INJ
10.0000 mg | INTRAVENOUS | Status: DC | PRN
  Filled 2013-10-28: qty 2

## 2013-10-28 MED ORDER — TETANUS-DIPHTH-ACELL PERTUSSIS 5-2.5-18.5 LF-MCG/0.5 IM SUSP: 0.5000 mL | Freq: Once | INTRAMUSCULAR | Status: DC

## 2013-10-28 MED ORDER — ZOLPIDEM TARTRATE 5 MG PO TABS: 5.0000 mg | ORAL_TABLET | Freq: Every evening | ORAL | Status: DC | PRN

## 2013-10-28 MED ORDER — BENZOCAINE-MENTHOL 20-0.5 % EX AERO
1.0000 "application " | INHALATION_SPRAY | CUTANEOUS | Status: DC | PRN
  Administered 2013-10-28: 1 via TOPICAL
  Filled 2013-10-28: qty 56

## 2013-10-28 MED ORDER — ACETAMINOPHEN 325 MG PO TABS
650.0000 mg | ORAL_TABLET | ORAL | Status: DC | PRN
Start: 2013-10-28 — End: 2013-10-28

## 2013-10-28 MED ORDER — FENTANYL 2.5 MCG/ML BUPIVACAINE 1/10 % EPIDURAL INFUSION (WH - ANES): 14.0000 mL/h | INTRAMUSCULAR | Status: DC | PRN

## 2013-10-28 MED ORDER — DIBUCAINE 1 % RE OINT: 1.0000 "application " | TOPICAL_OINTMENT | RECTAL | Status: DC | PRN

## 2013-10-28 MED ORDER — LANOLIN HYDROUS EX OINT: TOPICAL_OINTMENT | CUTANEOUS | Status: DC | PRN

## 2013-10-28 MED ORDER — PRENATAL MULTIVITAMIN CH
1.0000 | ORAL_TABLET | Freq: Every day | ORAL | Status: DC
  Administered 2013-10-29 – 2013-10-30 (×2): 1 via ORAL
  Filled 2013-10-28 (×2): qty 1

## 2013-10-28 MED ORDER — LACTATED RINGERS IV SOLN: 500.0000 mL | INTRAVENOUS | Status: DC | PRN

## 2013-10-28 MED ORDER — FLEET ENEMA 7-19 GM/118ML RE ENEM: 1.0000 | ENEMA | RECTAL | Status: DC | PRN

## 2013-10-28 MED ORDER — LIDOCAINE HCL (PF) 1 % IJ SOLN
30.0000 mL | INTRAMUSCULAR | Status: AC | PRN
  Administered 2013-10-28: 30 mL via SUBCUTANEOUS
  Filled 2013-10-28: qty 30

## 2013-10-28 MED ORDER — SIMETHICONE 80 MG PO CHEW: 80.0000 mg | CHEWABLE_TABLET | ORAL | Status: DC | PRN

## 2013-10-28 MED ORDER — DIPHENHYDRAMINE HCL 50 MG/ML IJ SOLN: 12.5000 mg | INTRAMUSCULAR | Status: DC | PRN

## 2013-10-28 MED ORDER — CITRIC ACID-SODIUM CITRATE 334-500 MG/5ML PO SOLN: 30.0000 mL | ORAL | Status: DC | PRN

## 2013-10-28 NOTE — Progress Notes (Signed)
Dr Reola CalkinsBeck explaining IV pain medication and epidural pain relief meds

## 2013-10-28 NOTE — Progress Notes (Signed)
Interpreter in room after multiple pages

## 2013-10-28 NOTE — Progress Notes (Signed)
Pt did not want to hold infant skin to skin.  With interpreter present benefits of skin to skin explained to pt.  Pt desired infant to be dried and wrapped in blankets then given to pt

## 2013-10-28 NOTE — H&P (Signed)
Attestation of Attending Supervision of Fellow: Evaluation and management procedures were performed by the Fellow under my supervision and collaboration.  I have reviewed the Fellow's note and chart, and I agree with the management and plan.    

## 2013-10-28 NOTE — H&P (Signed)
  LABOR ADMISSION HISTORY AND PHYSICAL   April GamblesMaribel J Aguirre is a 33 y.o. female [phone] with IUP at 6112w0d by LMP c/w 9 week US presenting for postdates induction.  Pt denies painful regular contractions, LOF, VB. +FM  [facility] at [facility] since 9 wks. Pregnancy hx complicated by hx of c/s for macrosomia (did not labor) and then 3 subsequent successful VBACs.  Otherwise pregnancy uncomplicated.   Prenatal History/Complications:  Past Medical History: History reviewed. No pertinent past medical history.  Past Surgical History: Past Surgical History  Procedure Laterality Date  . Cesarean section      Obstetrical History: OB History   Grav Para Term Preterm Abortions TAB SAB Ect Mult Living   6 4 4  1  1   4      G1- LTCS for macrosomia 9lb G2-5- VBAC, largest 7lb14oz  Social History: History   Social History  . Marital Status: Married    Spouse Name: N/A    Number of Children: N/A  . Years of Education: N/A   Social History Main Topics  . Smoking status: Never Smoker   . Smokeless tobacco: Never Used  . Alcohol Use: Yes     Comment: not now  . Drug Use: No  . Sexual Activity: Not Currently    Birth Control/ Protection: None   Other Topics Concern  . None   Social History Narrative  . None    Family History: Family History  Problem Relation Age of Onset  . Diabetes Mother   . Diabetes Brother   . Diabetes Brother   . Diabetes Brother     Allergies: No Known Allergies  Prescriptions prior to admission  Medication Sig Dispense Refill  . Prenatal Vit-Fe Fumarate-FA (PRENATAL MULTIVITAMIN) TABS tablet Take 1 tablet by mouth daily at 12 noon.         Review of Systems  All systems reviewed and negative except as stated in HPI    Blood pressure 113/71, pulse 71, temperature 97.9 F (36.6 C), temperature source Oral, resp. rate 20, height 5\' 2"  (1.575 m), weight 80.287 kg (177 lb), last menstrual period 01/14/2013. General appearance: alert,  cooperative and no distress Lungs: clear to auscultation bilaterally Heart: regular rate and rhythm Abdomen: soft, non-tender; bowel sounds normal Extremities: Homans sign is negative, no sign of DVT  Presentation: cephalic Fetal monitoringBaseline: 145 bpm, Variability: Good {> 6 bpm), Accelerations: Reactive and Decelerations: Absent Uterine activity occasional, irregular  Dilation: 3 Effacement (%): 70 Station: -3 Exam by:: Dr Cristy HiltsBecki   Prenatal labs: ABO, Rh: O/POS/-- (07/22 1209) Antibody: NEG (07/22 1209) Rubella:   RPR: NON REAC (07/22 1209)  HBsAg: NEGATIVE (07/22 1209)  HIV: NON REACTIVE (07/22 1209)  GBS: NEGATIVE (01/20 1623)  1 hr Glucola normal Genetic screening  declined Anatomy US normal    Assessment: April GamblesMaribel J Aguirre is a 33 y.o. [id number] with an IUP at 3812w0d presenting for IOL for postdates. History obtaiend through interpreter.   Plan: 1) admit to L&D - routine orders - plans for natural child birth  2) IOL - favorable cervix, start with pitocin   3) FWB - cat I tracing - GBS neg - EFW 8.5#   4) anticipate SVD   Antonio Creswell L, MD 10/28/2013, 10:10 AM

## 2013-10-28 NOTE — Progress Notes (Signed)
Pt had requested pain medication.  RN about to prepare med.  Pt c/o urge to push.  SVE performed pain medication held

## 2013-10-28 NOTE — Progress Notes (Signed)
Assunta GamblesMaribel J Jimenez-Sandoval is a 33 y.o. [id number] at 5941w0d  admitted for induction of labor due to postdates.  Subjective:  Doing well. Having more pain with contractions. +FM  Objective: BP 129/85  Pulse 66  Temp(Src) 98.2 F (36.8 C) (Oral)  Resp 20  Ht 5\' 2"  (1.575 m)  Wt 80.287 kg (177 lb)  BMI 32.37 kg/m2  LMP 01/14/2013      FHT:  FHR: 150 bpm, variability: moderate,  accelerations:  Present,  decelerations:  Present variables UC:   1-4 min SVE:   Dilation: 3 Effacement (%): 90 Station: -2 Exam by:: Dr Reola CalkinsBeck  Labs: Lab Results  Component Value Date   WBC 11.1* 10/28/2013   HGB 12.2 10/28/2013   HCT 34.9* 10/28/2013   MCV 96.9 10/28/2013   PLT 275 10/28/2013    Assessment / Plan: IOL for postdates  Labor: AROM performed with return of light mec stained fluid.  attempted IUPC but pt didn't tolerate it due to pain.  Fetal Wellbeing:  Category II Pain Control:  Labor support without medications I/D:  n/a Anticipated MOD:  NSVD  Siriyah Ambrosius L 10/28/2013, 4:05 PM

## 2013-10-29 NOTE — Lactation Note (Signed)
This note was copied from the chart of April Aguirre. Lactation Consultation Note Initial visit at 23 hours of age.  Mom is attempting breast feeding in cradle hold on left breast without nipple shield, baby mouthing.  Assisted mom to use nipple shield with instruction on application.  Baby is able to get a deep wide latch with only a few rhythmic sucks and asleep with stimulation not waking baby.  Called interpreter to room to assist with education.  Mom has experience with older children for feeding about 1-2 months and using formula.  Mom has hand pump and shells.  Hand pump used to evert left nipple, nipple everts but no visible colostrum on this nipple.  Encouraged mom to use hand pump and attempt latch without nipple shield as mom reports no colostrum in nipple shield with previous attempts.  Right nipple more erect and compressible mom reports baby does better on that side.  Hand expression demonstrated with a drop of colostrum noted and rubbed into nipple that has a positional striped bruise above her nipple.  Discussed at length cross cradle hold to get a deep latch.  Discussed frequency of feedings and to wake baby is he continues to be sleepy.  Mom agrees to pump for 15 minutes with feedings about 6 times a day to help with milk supply.  DEBP set up at bedside and in use.  [facility] and will consider supplementation as needed.  [facility] given and discussed,  Encouraged cue based feedings and skin to skin.  Mom to call for assist as needed.  FOB at bedside supportive.    Patient Name: April Aguirre [id number]'QToday's Date: 10/29/2013 Reason for consult: Initial assessment   Maternal Data Has patient been taught Hand Expression?: Yes Does the patient have breastfeeding experience prior to this delivery?: Yes  Feeding Feeding Type: Breast Fed Length of feed: 1 min  LATCH Score/Interventions Latch: Grasps breast easily, tongue down, lips flanged, rhythmical  sucking. Intervention(s): Adjust position;Assist with latch  Audible Swallowing: None Intervention(s): Hand expression  Type of Nipple: Inverted Intervention(s): Double electric pump;Hand pump;Shells  Comfort (Breast/Nipple): Filling, red/small blisters or bruises, mild/mod discomfort  Problem noted:  (positional striped bruise on right nipple)  Hold (Positioning): Assistance needed to correctly position infant at breast and maintain latch. Intervention(s): Breastfeeding basics reviewed;Position options  LATCH Score: 4  Lactation Tools Discussed/Used Tools: Shells;Nipple Shields Nipple shield size: 24 Pump Review: Setup, frequency, and cleaning Initiated by:: JS Date initiated:: 10/29/13   Consult Status Consult Status: Follow-up Date: 10/30/13 Follow-up type: In-patient    Beverely RisenShoptaw, Arvella MerlesJana Lynn 10/29/2013, 5:37 PM

## 2013-10-29 NOTE — Lactation Note (Signed)
This note was copied from the chart of April Aguirre. Lactation Consultation Note  Patient Name: April Aguirre ZOXWR'UToday's Date: 10/29/2013     Maternal Data Formula Feeding for Exclusion: Yes Reason for exclusion: Mother's choice to formula and breast feed on admission  Feeding    [facility]                      Lactation Tools Discussed/Used     Consult Status      Alfred LevinsLee, Franz Svec Anne 10/29/2013, 8:21 AM

## 2013-10-29 NOTE — Progress Notes (Signed)
Post Partum Day 1 Subjective: no complaints, up ad lib, voiding, tolerating PO and + flatus  Objective: Blood pressure 114/72, pulse 66, temperature 97.9 F (36.6 C), temperature source Oral, resp. rate 18, height 5\' 2"  (1.575 m), weight 80.287 kg (177 lb), last menstrual period 01/14/2013, SpO2 100.00%, unknown if currently breastfeeding.  Physical Exam:  General: alert, cooperative, appears stated age and no distress Lochia: appropriate Uterine Fundus: Firm @ U-2 DVT Evaluation: No evidence of DVT seen on physical exam. Negative Homan's sign. No cords or calf tenderness. No significant calf/ankle edema.   Recent Labs  10/28/13 0825  HGB 12.2  HCT 34.9*    Assessment/Plan: Plan for discharge tomorrow, Breastfeeding and Contraception depo   LOS: 1 day   Keian Odriscoll RYAN 10/29/2013, 7:49 AM

## 2013-10-30 MED ORDER — IBUPROFEN 600 MG PO TABS: 600.0000 mg | ORAL_TABLET | Freq: Four times a day (QID) | ORAL | Status: DC

## 2013-10-30 NOTE — Discharge Instructions (Signed)

## 2013-10-30 NOTE — Progress Notes (Signed)
UR chart review completed.  

## 2013-10-30 NOTE — Discharge Summary (Signed)
I spoke with and examined patient and agree with PA-S's note and plan of care.  April ScaleMichael Aguirre [name], MD Ob Fellow 10/30/2013 8:58 AM

## 2013-10-30 NOTE — Discharge Summary (Signed)
Attestation of Attending Supervision of Advanced Practitioner (CNM/NP): Evaluation and management procedures were performed by the Advanced Practitioner under my supervision and collaboration.  I have reviewed the Advanced Practitioner's note and chart, and I agree with the management and plan.  Cartel Mauss 10/30/2013 10:12 AM   

## 2013-10-30 NOTE — Discharge Summary (Addendum)
Obstetric Discharge Summary Reason for Admission: postdates induction Prenatal Procedures: none Intrapartum Procedures: spontaneous vaginal delivery Postpartum Procedures: none Complications-Operative and Postpartum: 1st degree perineal laceration  Hemoglobin  Date Value Ref Range Status  10/28/2013 12.2  12.0 - 15.0 g/dL Final     HCT  Date Value Ref Range Status  10/28/2013 34.9* 36.0 - 46.0 % Final    Delivery Note  At 5:35 PM a viable female was delivered via Vaginal, Spontaneous Delivery (Presentation: Left Occiput Anterior). APGAR: 8, 9; weight 9 lb 0.4 oz (4094 g).  Placenta status: Intact, Spontaneous. Cord: 3 vessels with the following complications: None.  Anesthesia: None  Episiotomy: None  Lacerations: 1st degree  Suture Repair: 3.0 vicryl rapide  Est. Blood Loss (mL): 350cc  Mom to postpartum. Baby to Couplet care / Skin to Skin.  Pt progressed quickly to complete and pushed with good maternal effort to deliver a liveborn female newborn via VBAC. Spontaneous cry. Placenta delivered intact with 3V cord with traction and pitocin. 1st degree repaired for cosmesis as extended down the external perineum. ebl 350cc. No complications. Baby to skin to skin and mom to postpartum.  BECK, KELI L  10/28/2013, 6:03 PM   Physical Exam:  General: alert, cooperative and no distress Lochia: appropriate Uterine Fundus: firm Incision: N/A DVT Evaluation: No evidence of DVT seen on physical exam. Negative Homan's sign. No significant calf/ankle edema.   Hospital Course: April Aguirre is a 33 y.o. [id number] who presented for postdates induction at 8769w0d. She progressed well and delivered a viable female via VBAC over 1st degree perineal laceration with repair. Postpartum care was uncomplicated. She is being discharged in stable condition. She is currently breastfeeding and plans for Depo-Provera for contraception. No plans for circumcision for her baby boy at this time.     Discharge Diagnoses: Term Pregnancy-delivered  Discharge Information: Date: 10/30/2013 Activity: pelvic rest Diet: routine Medications: PNV, Ibuprofen and Iron Condition: stable Instructions: refer to practice specific booklet Discharge to: home  Follow-up Information   [facility]   Specialty:  Obstetrics and Gynecology   Contact information:   [address] [place] [address] [phone]      Follow up with [facility]. Schedule an appointment as soon as possible for a visit in 4 weeks. (Postpartum follow-up. )    Contact information:   [address] [place] [id number] [phone]      Newborn Data: Live born female  Birth Weight: 9 lb 0.4 oz (4094 g) APGAR: 8, 9  Home with mother.  [place], JosephvilleBennie-John, T PA-S 10/30/2013, 7:20 AM  I spoke with and examined patient and agree with PA-S's note and plan of care.  Tawana ScaleMichael Ryan Ferrel Simington, MD Ob Fellow 10/30/2013 9:03 AM

## 2013-10-30 NOTE — Lactation Note (Addendum)
This note was copied from the chart of April Aguirre. Lactation Consultation Note: Mom reports that breasts are feeling very full this morning. Has pumped before I came into room. Obtained about 5 cc's from right breast.- none from left. Awakened baby - attempted to latch to left breast without NS. Baby latched well but needed stimulation to continue nursing. Nursed on and off for 40 minutes. Did a little better on right.Baby dressed by Dad and mom pumping. Ice packs to mom for after pumping. Baby fussy after getting dressed. Latched again to right breast.  To pump after baby finishes nursing. #30 flanges given to mom and she reports that feels better.To call for assist prn  Patient Name: April Aguirre ZOXWR'UToday's Date: 10/30/2013 Reason for consult: Follow-up assessment   Maternal Data Does the patient have breastfeeding experience prior to this delivery?: Yes  Feeding Feeding Type: Breast Fed Length of feed: 40 min  LATCH Score/Interventions Latch: Repeated attempts needed to sustain latch, nipple held in mouth throughout feeding, stimulation needed to elicit sucking reflex. (very sleepy)  Audible Swallowing: A few with stimulation  Type of Nipple: Everted at rest and after stimulation Intervention(s): Shells  Comfort (Breast/Nipple): Filling, red/small blisters or bruises, mild/mod discomfort  Problem noted: Filling;Mild/Moderate discomfort Interventions (Mild/moderate discomfort): Post-pump;Pre-pump if needed;Hand expression;Hand massage  Hold (Positioning): Assistance needed to correctly position infant at breast and maintain latch. Intervention(s): Breastfeeding basics reviewed;Support Pillows  LATCH Score: 6  Lactation Tools Discussed/Used     Consult Status      Pamelia HoitWeeks, Arelie Kuzel D 10/30/2013, 11:11 AM

## 2013-10-31 ENCOUNTER — Encounter (HOSPITAL_COMMUNITY)
Admission: RE | Admit: 2013-10-31 | Discharge: 2013-10-31 | Disposition: A | Discharge status: Home / Self Care | Payer: Medicaid Other | Source: Ambulatory Visit | Attending: Obstetrics and Gynecology | Admitting: Obstetrics and Gynecology

## 2013-10-31 ENCOUNTER — Ambulatory Visit: Payer: Self-pay

## 2013-10-31 DIAGNOSIS — O923 Agalactia: Secondary | ICD-10-CM | POA: Insufficient documentation

## 2013-10-31 NOTE — Lactation Note (Addendum)
This note was copied from the chart of April Aguirre. Lactation Consultation Note Follow up consult:  Baby April 4464 hours old. Used interpreter line.  Mother engorged. Mother latched baby in cross cradle position without NS briefly on left.  A few sucks observed.  Mother then put on NS, rhythmical sucks and swallows observed.  Milk viewed in NS. Baby breastfed 15 min.  Recently breastfed at 840 am.  Reviewed engorgement plan with mother.  Plan is to breastfeed baby first.  Then post pump for approx. 10 min. until breast is soft. 4x a day and ice after each feeding until engorgement resolves.  Ice for 20 min every 2-3 hours until breasts adjust.  FOB rented breast pump.  Reviewed milk storage guidelines.   Patient Name: April Aguirre UVOZD'GToday's Date: 10/31/2013 Reason for consult: Follow-up assessment   Maternal Data    Feeding Feeding Type: Breast Fed Nipple Type: Slow - flow Length of feed: 15 min  LATCH Score/Interventions Latch: Repeated attempts needed to sustain latch, nipple held in mouth throughout feeding, stimulation needed to elicit sucking reflex. Intervention(s): Breast massage  Audible Swallowing: Spontaneous and intermittent Intervention(s): Skin to skin Intervention(s): Alternate breast massage  Type of Nipple: Everted at rest and after stimulation Intervention(s): Shells;Hand pump;Double electric pump Intervention(s): Double electric pump  Comfort (Breast/Nipple): Engorged, cracked, bleeding, large blisters, severe discomfort Problem noted: Engorgment Intervention(s): Ice  Problem noted: Mild/Moderate discomfort Interventions (Filling): Massage Interventions (Mild/moderate discomfort): Post-pump  Hold (Positioning): No assistance needed to correctly position infant at breast.  LATCH Score: 7  Lactation Tools Discussed/Used     Consult Status Consult Status: Follow-up Date: 10/31/13 Follow-up type: In-patient    April Aguirre,  April Aguirre April Aguirre 10/31/2013, 10:04 AM

## 2013-12-06 ENCOUNTER — Encounter: Payer: Self-pay | Admitting: Women's Health

## 2013-12-06 ENCOUNTER — Ambulatory Visit (INDEPENDENT_AMBULATORY_CARE_PROVIDER_SITE_OTHER): Payer: Self-pay | Admitting: Women's Health

## 2013-12-06 VITALS — BP 100/72 | Ht 63.0 in | Wt 164.0 lb

## 2013-12-06 DIAGNOSIS — Z3480 Encounter for supervision of other normal pregnancy, unspecified trimester: Secondary | ICD-10-CM

## 2013-12-06 DIAGNOSIS — Z3202 Encounter for pregnancy test, result negative: Secondary | ICD-10-CM

## 2013-12-06 DIAGNOSIS — Z98891 History of uterine scar from previous surgery: Secondary | ICD-10-CM

## 2013-12-06 LAB — POCT URINE PREGNANCY: Preg Test, Ur: NEGATIVE
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
## 2013-12-06 NOTE — Patient Instructions (Signed)
[facility] Dep[facility] shot. No sex until after you get the shot  Medroxyprogesterone injection [Contraceptive]- Depo Qu es este medicamento? Las inyecciones anticonceptivas de MEDROXIPROGESTERONA previenen Firefighterel embarazo. Las ConocoPhillipsinyecciones le brindarn control de la natalidad durante 3 meses. La Depo-subQ Provera 104 se utiliza tambin para tratar Starwood Hotelsel dolor relacionado con endometriosis. Este medicamento puede ser utilizado para otros usos; si tiene alguna pregunta consulte con su proveedor de atencin mdica o con su farmacutico. MARCAS COMERCIALES DISPONIBLES: Depo-Provera, Depo-subQ Provera 104 Qu le debo informar a mi profesional de la salud antes de tomar este medicamento? Necesita saber si usted presenta alguno de los siguientes problemas o situaciones: -si consume alcohol con frecuencia -asma -enfermedad vascular o antecedente de cogulos sanguneos en los pulmones o las piernas -enfermedad de los [place]uesos, como osteoporosis -cncer de mama -diabetes -trastornos de la alimentacin (anorexia nerviosa o bulimia) -alta presin sangunea -infecciones por VIH o SIDA -enfermedad renal -enfermedad heptica -depresin mental -migraa -convulsiones -derrame cerebral -fuma tabaco -sangrado vaginal -una reaccin alrgica o inusual a la medroxiprogesterona, otras hormonas, otros medicamentos, alimentos, colorantes o conservantes -si est embarazada o buscando quedar embarazada -si est amamantando a un beb Cmo debo utilizar este medicamento? El anticonceptivo de Depo-Provera se inyecta por va intramuscular. La Depo-SubQ Provera 104 se inyecta por va subcutnea. Las Auto-Owners Insuranceadministra un profesional de Radiographer, therapeuticla salud. Usted no puede estar embarazada antes de recibir una inyeccin. La inyeccin normalmente se aplica durante los primeros 5 das despus de comenzar un perodo menstrual o 6 semanas despus de un parto. Hable con su pediatra para informarse  acerca del uso de este medicamento en nios. Puede requerir atencin especial. Estas inyecciones han sido usadas en nias que han empezados a tener perodos [place] Parkmenstruales. Sobredosis: Pngase en contacto inmediatamente con un centro toxicolgico o una sala de urgencia si usted cree que haya tomado demasiado medicamento. ATENCIN: Reynolds AmericanEste medicamento es solo para usted. No comparta este medicamento con nadie. Qu sucede si me olvido de una dosis? Trate de no olvidar ninguna dosis. Para mantener el control de natalidad necesitar una inyeccin cada 3 meses. Si no puede asistir a una cita, comunquese con su profesional de la salud para que se la [place]. Si espera ms de 13 semanas entre las inyecciones anticonceptivos de Depo-Provera o ms de 14 semanas entre inyecciones anticonceptivos de Depo-subQ Provera 104, puede quedarse [place]mbarazada. Si no puede asistir a su cita utilice otro mtodo anticonceptivo. Tal vez deba hacerse una prueba de embarazo antes de recibir otra inyeccin. Qu puede interactuar con este medicamento? No tome esta medicina con ninguno de los siguientes medicamentos: -bosentano Esta medicina tambin puede interactuar con los siguientes medicamentos: -aminoglutethimide -antibiticos o medicamentos para infecciones, especialmente rifampicina, rifabutina, rifapentina y griseofulvina -aprepitant -barbitricos, tales como el fenobarbital o primidona -bexaroteno -carbamazepina -medicamentos para convulsiones, tales como etotona, felbamato, Venezuelaoxcarbazepina, Adafenitona, topiramato -modafinilo -hierba de [address]San [place] Puede ser que esta lista no menciona todas las posibles interacciones. Informe a su profesional de Beazer Homesla salud de Ingram Micro Inctodos los productos a base de hierbas, medicamentos de [place]venta libre o suplementos nutritivos que est tomando. Si usted fuma, consume bebidas alcohlicas o si utiliza drogas ilegales, indqueselo tambin a su profesional de Beazer Homesla salud. Algunas sustancias pueden interactuar con su  medicamento. A qu debo estar atento al usar PPL Corporationeste medicamento? Este medicamento no la protege de la infeccin por VIH (SIDA) ni de otras enfermedades de transmisin sexual. El uso de este producto puede provocar una prdida de calcio de sus huesos.  La prdida de calcio puede provocar huesos dbiles (osteoporosis). Slo use este producto durante ms de 2 aos si otras formas de anticonceptivos no son apropiados para usted. Mientre ms tiempo use este producto para el control de la natalidad, tendr ms riesgo de Marine scientist de NiSource. Consulte a su profesional de Contractor de cmo puede Big Lots fuertes. Puede experimentar un cambio en el patrn de sangrado o periodos irregulares. Muchas mujeres dejan de tener periodos [employer]. Si recibe sus inyecciones a tiempo, la posibilidad de quedarse embarazada es muy baja. Si cree que podr AES Corporation, visite a su profesional de la salud lo antes posible. Si desea quedar embarazada dentro del prximo ao, informe a su profesional de Beazer Homes. [place] de este medicamento puede perdurar durante mucho tiempo despus de recibir su ltima inyeccin. Qu efectos secundarios puedo tener al Boston Scientific este medicamento? Efectos secundarios que debe informar a su mdico o a Producer, television/film/video de la salud tan pronto como sea posible: -Therapist, art como erupcin cutnea, picazn o urticarias, hinchazn de la cara, labios o lengua -secreciones o sensibilidad de las mamas -problemas respiratorios -cambios en la visin -depresin -sensacin de desmayos o mareos, cadas -fiebre -dolor en el abdomen, pecho, entrepierna o piernas -problemas de coordinacin, del habla, al caminar -cansancio o debilidad inusual -color amarillento de los ojos o la piel Efectos secundarios que, por lo general, no requieren Psychologist, prison and probation services (debe informarlos a mdico o a Producer, television/film/video de la salud si persisten o si son  molestos): -cne -retencin de lquidos e hinchazn -dolor de cabeza -perodos menstruales irregulares, manchando o ausencia de perodos menstruales -dolor, picazn o reaccin cutnea temporal en el lugar de la inyeccin -aumento de peso Puede ser que esta lista no menciona todos los posibles efectos secundarios. Comunquese a su mdico por asesoramiento mdico Hewlett[employer]ard. Usted puede informar los efectos secundarios a la FDA por telfono al 1-800-FDA-1088. Dnde debo guardar mi medicina? No se aplica en este caso. Un profesional de Associate Professor las inyecciones. ATENCIN: Este folleto es un resumen. Puede ser que no cubra toda la posible informacin. Si usted tiene preguntas acerca de esta medicina, consulte con su mdico, su farmacutico o su profesional de Radiographer, therapeutic.  2014, Elsevier/Gold Standard. (2008-11-05 15:09:00)

## 2014-07-09 ENCOUNTER — Encounter: Payer: Self-pay | Admitting: Women's Health

## 2019-06-06 ENCOUNTER — Other Ambulatory Visit (HOSPITAL_COMMUNITY): Payer: Self-pay | Admitting: Nurse Practitioner

## 2019-06-06 DIAGNOSIS — Z1231 Encounter for screening mammogram for malignant neoplasm of breast: Secondary | ICD-10-CM

## 2019-06-19 ENCOUNTER — Other Ambulatory Visit: Payer: Self-pay

## 2019-06-19 ENCOUNTER — Ambulatory Visit (HOSPITAL_COMMUNITY)
Admission: RE | Admit: 2019-06-19 | Discharge: 2019-06-19 | Disposition: A | Discharge status: Home / Self Care | Payer: Self-pay | Source: Ambulatory Visit | Attending: Nurse Practitioner | Admitting: Nurse Practitioner

## 2019-06-19 DIAGNOSIS — Z1231 Encounter for screening mammogram for malignant neoplasm of breast: Secondary | ICD-10-CM | POA: Insufficient documentation

## 2019-06-21 ENCOUNTER — Other Ambulatory Visit (HOSPITAL_COMMUNITY): Payer: Self-pay | Admitting: Nurse Practitioner

## 2019-06-21 ENCOUNTER — Other Ambulatory Visit (HOSPITAL_COMMUNITY): Payer: Self-pay | Admitting: *Deleted

## 2019-06-21 DIAGNOSIS — N63 Unspecified lump in unspecified breast: Secondary | ICD-10-CM

## 2019-06-21 DIAGNOSIS — N6320 Unspecified lump in the left breast, unspecified quadrant: Secondary | ICD-10-CM

## 2019-06-27 ENCOUNTER — Ambulatory Visit (HOSPITAL_COMMUNITY): Admission: RE | Admit: 2019-06-27 | Payer: PRIVATE HEALTH INSURANCE | Source: Ambulatory Visit

## 2019-06-27 ENCOUNTER — Ambulatory Visit (HOSPITAL_COMMUNITY)
Admission: RE | Admit: 2019-06-27 | Discharge: 2019-06-27 | Disposition: A | Discharge status: Home / Self Care | Payer: PRIVATE HEALTH INSURANCE | Source: Ambulatory Visit | Attending: Nurse Practitioner | Admitting: Nurse Practitioner

## 2019-06-27 ENCOUNTER — Other Ambulatory Visit: Payer: Self-pay

## 2019-06-27 DIAGNOSIS — N63 Unspecified lump in unspecified breast: Secondary | ICD-10-CM | POA: Insufficient documentation

## 2021-01-15 IMAGING — MG DIGITAL SCREENING BILAT W/ TOMO W/ CAD
6 of 10 series · 6 of 30 positions shown · non-contrast
Comparison: None.

CLINICAL DATA: Screening. Baseline.

EXAM:
DIGITAL SCREENING BILATERAL MAMMOGRAM WITH TOMO AND CAD

[R MLO synth-2D]
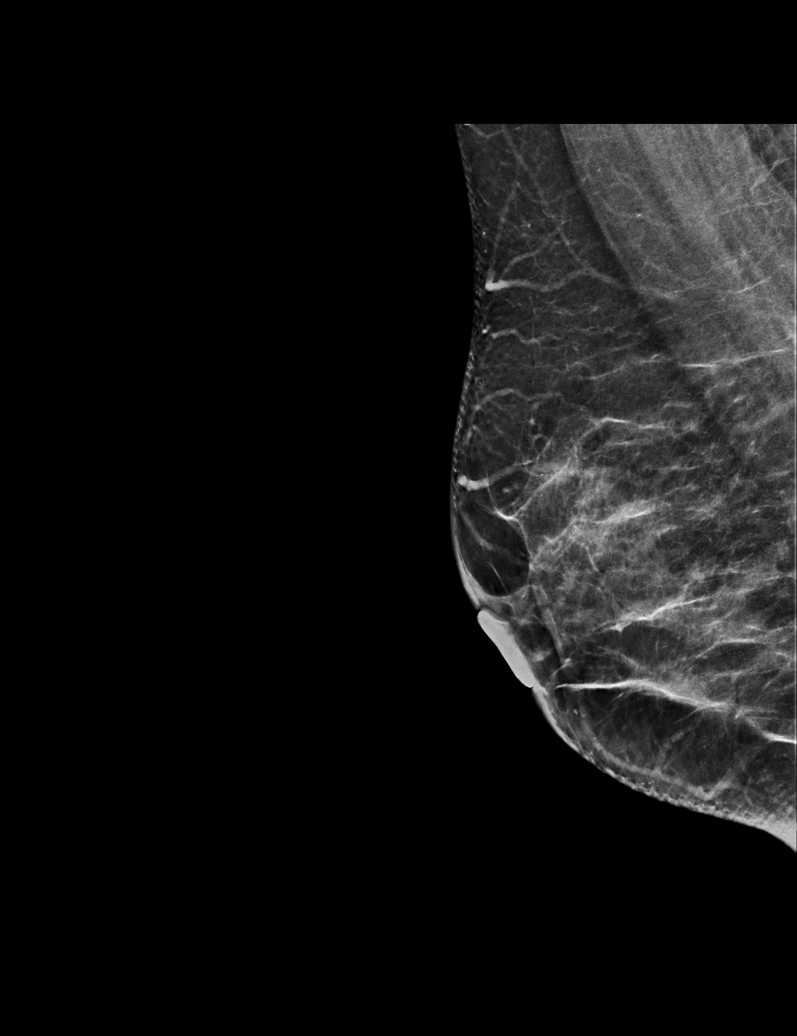

[L CC synth-2D (1 of 2)]
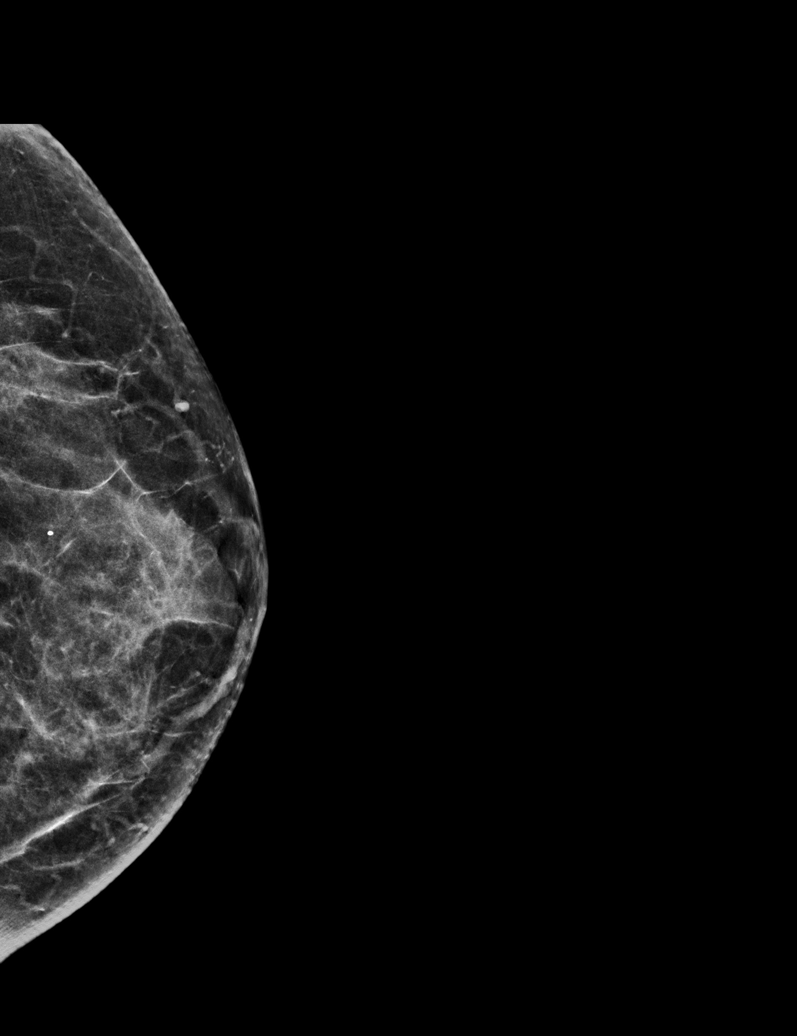

[L CC synth-2D (2 of 2)]
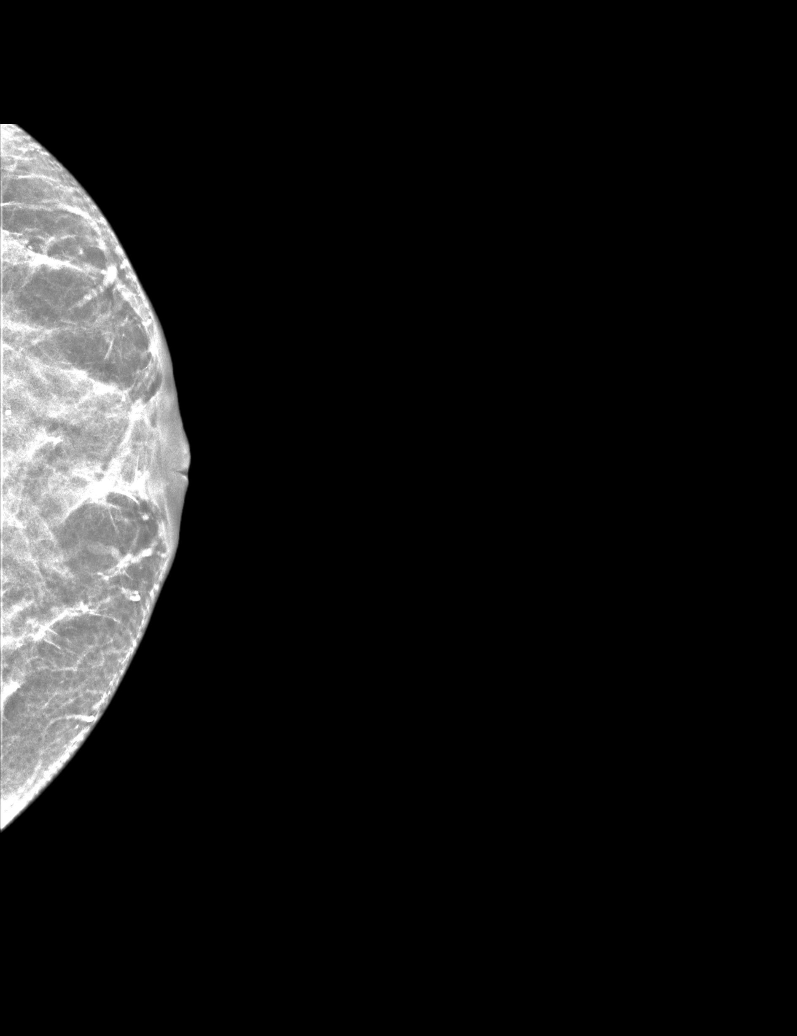

[L MLO synth-2D]
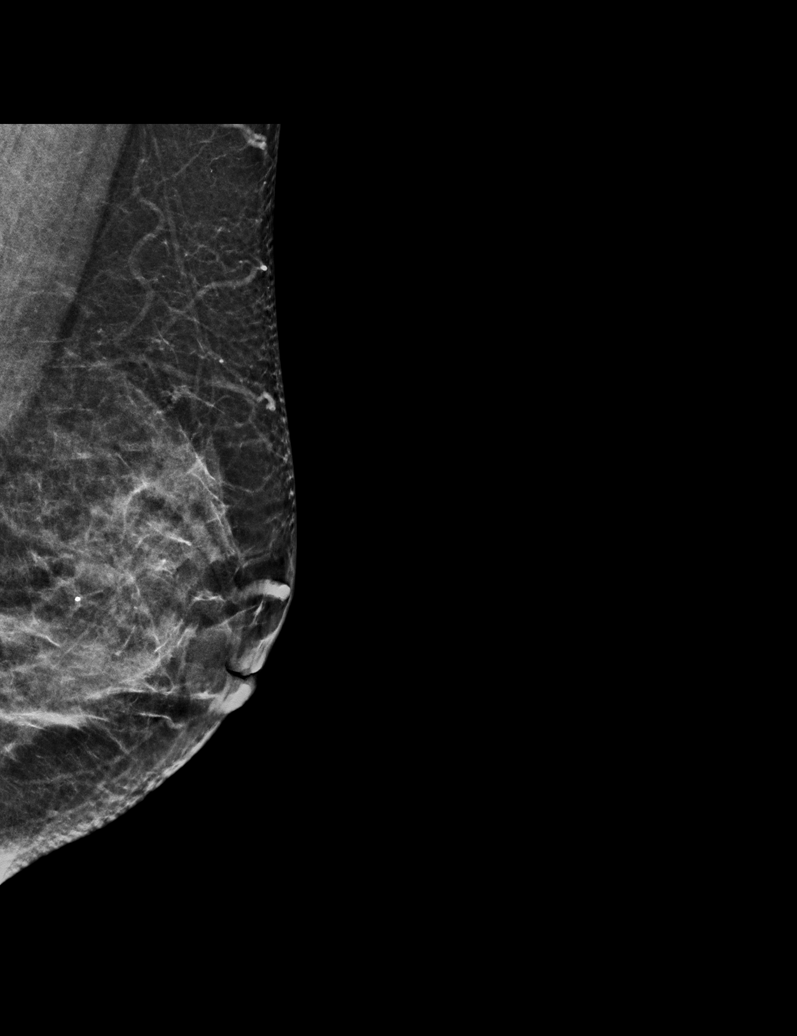

[R CC synth-2D]
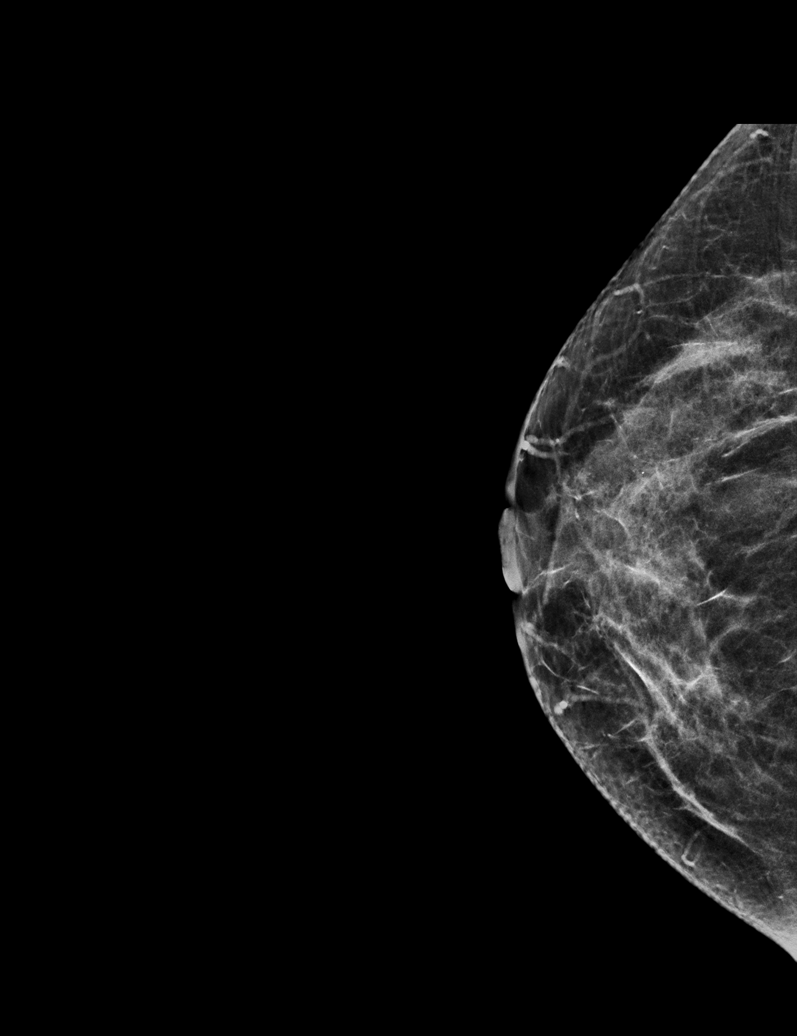

[L MLO tomo · tomo slice 29/57.0]
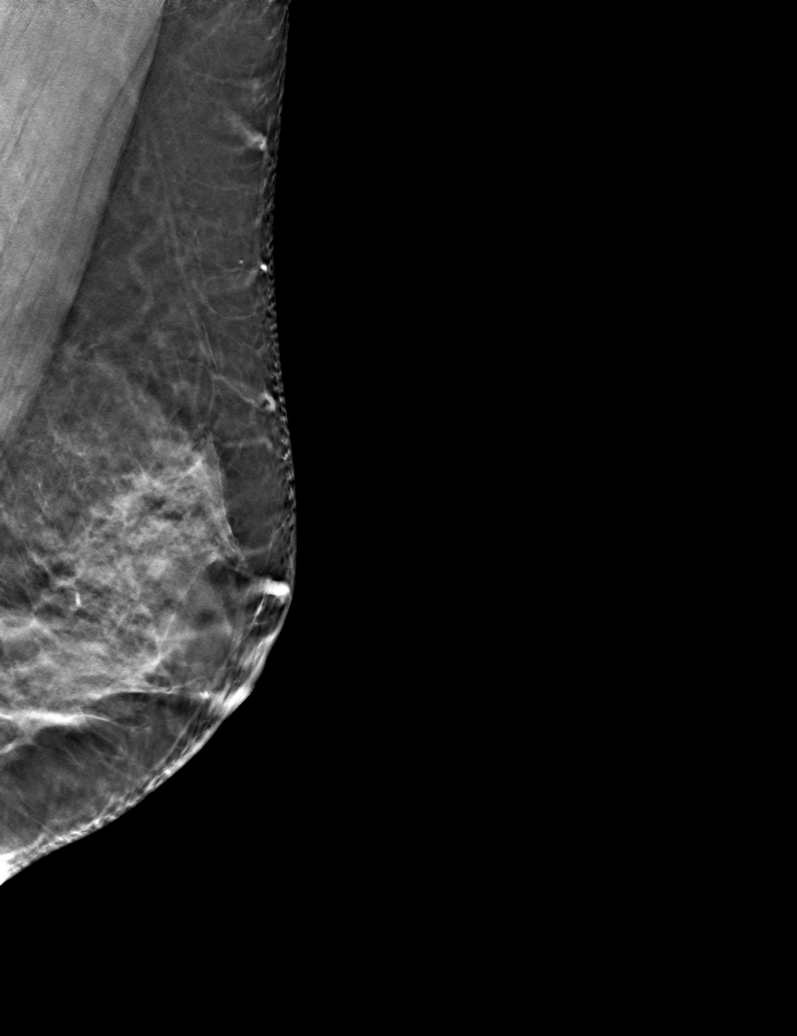

[6 of 30 positions shown; findings below may reference images not displayed]

ACR Breast Density Category c: The breast tissue is heterogeneously
dense, which may obscure small masses.
FINDINGS: In the left breast, a possible mass warrants further evaluation.
This possible mass is seen within the upper LEFT breast, at middle
depth, MLO slice 28, possible correlate within the slightly outer
LEFT breast on the CC view.

In the right breast, no findings suspicious for malignancy.

Images were processed with CAD.
IMPRESSION: Further evaluation is suggested for possible mass in the left
breast.

RECOMMENDATION:
Diagnostic mammogram and possibly ultrasound of the left breast.
(Code:[id number])

The patient will be contacted regarding the findings, and additional
imaging will be scheduled.

BI-RADS CATEGORY  0: Incomplete. Need additional imaging evaluation
and/or prior mammograms for comparison.

## 2022-07-14 ENCOUNTER — Other Ambulatory Visit: Payer: Self-pay

## 2022-07-14 DIAGNOSIS — N6321 Unspecified lump in the left breast, upper outer quadrant: Secondary | ICD-10-CM

## 2022-07-16 ENCOUNTER — Other Ambulatory Visit: Payer: Self-pay | Admitting: Obstetrics and Gynecology

## 2022-07-16 DIAGNOSIS — N6321 Unspecified lump in the left breast, upper outer quadrant: Secondary | ICD-10-CM

## 2022-07-17 ENCOUNTER — Inpatient Hospital Stay: Payer: Self-pay | Attending: Obstetrics and Gynecology | Admitting: Hematology and Oncology

## 2022-07-17 VITALS — BP 129/79 | Wt 151.2 lb

## 2022-07-17 DIAGNOSIS — N6321 Unspecified lump in the left breast, upper outer quadrant: Secondary | ICD-10-CM

## 2022-07-17 MED ORDER — ATORVASTATIN CALCIUM 40 MG PO TABS: 40.0000 mg | ORAL_TABLET | Freq: Every day | ORAL | 0 refills | Status: DC

## 2022-07-17 MED ORDER — LISINOPRIL 20 MG PO TABS: 20.0000 mg | ORAL_TABLET | Freq: Every day | ORAL | 0 refills | Status: DC

## 2022-07-17 MED ORDER — LEVOTHYROXINE SODIUM 50 MCG PO TABS: 50.0000 ug | ORAL_TABLET | Freq: Every day | ORAL | 0 refills | Status: DC

## 2022-07-17 MED ORDER — PREDNISONE 20 MG PO TABS: 20.0000 mg | ORAL_TABLET | Freq: Every day | ORAL | 0 refills | Status: DC

## 2022-07-17 MED ORDER — AMLODIPINE BESYLATE 10 MG PO TABS: 10.0000 mg | ORAL_TABLET | Freq: Every day | ORAL | 0 refills | Status: DC

## 2022-07-17 MED ORDER — HYDROCHLOROTHIAZIDE 25 MG PO TABS: 25.0000 mg | ORAL_TABLET | Freq: Every day | ORAL | 0 refills | Status: DC

## 2022-07-17 MED ORDER — POTASSIUM CHLORIDE CRYS ER 10 MEQ PO TBCR: 10.0000 meq | EXTENDED_RELEASE_TABLET | Freq: Every day | ORAL | 0 refills | Status: DC

## 2022-07-17 MED ORDER — PANTOPRAZOLE SODIUM 40 MG PO TBEC: 40.0000 mg | DELAYED_RELEASE_TABLET | Freq: Every day | ORAL | 0 refills | Status: DC

## 2022-07-17 NOTE — Patient Instructions (Signed)
Taught April Aguirre about breast self awareness. Patient did not need a Pap smear today due to last Pap smear was in 07/2022 per patient. Let her know [facility] will cover Pap smears every 5 years unless has a history of abnormal Pap smears. [facility]erred [facility] to [facility] for [facility]ogram. Appointment scheduled for 07/21/22. Patient aware of appointment and will be there. Let patient know will follow up with her within the next couple weeks with results. April Aguirre verbalized understanding. She will return to clinic in one year for annual mammogram.   Pascal Lux, NP 10:27 AM

## 2022-07-17 NOTE — Progress Notes (Signed)
Ms. April Aguirre is a 41 y.o. female who presents to [facility] clinic today with no complaints .    Pap Smear: Pap not smear completed today. Last Pap smear was 07/2022 at [facility] and results are pending.. Per patient has history of an abnormal Pap smear in 2010 and 2012 with benign colposcopy. At least 3 normals since then. Last Pap smear result is not available in Epic.    Physical exam: Breasts Breasts symmetrical. No skin abnormalities bilateral breasts. No nipple retraction bilateral breasts. No nipple discharge bilateral breasts. No lymphadenopathy. No lumps palpated bilateral breasts.     MS DIGITAL DIAG TOMO UNI LEFT  Result Date: 06/27/2019 CLINICAL DATA:  Screening recall from baseline mammography for possible left breast mass. EXAM: DIGITAL DIAGNOSTIC UNILATERAL LEFT MAMMOGRAM WITH CAD AND TOMO COMPARISON:  Mammogram 06/19/2019. ACR Breast Density Category c: The breast tissue is heterogeneously dense, which may obscure small masses. FINDINGS: Spot compression tomograms were performed of the left breast. The initially questioned possible left breast mass appears to resolve on the additional imaging with findings compatible with an area of overlapping fibroglandular tissue. There is no mammographic evidence of malignancy in the left breast. Mammographic images were processed with CAD. IMPRESSION: No mammographic evidence of malignancy in the left breast. RECOMMENDATION: Screening mammogram in one year.(Code:SM-B-01Y) I have discussed the findings and recommendations with the patient. If applicable, a reminder letter will be sent to the patient regarding the next appointment. BI-RADS CATEGORY  1: Negative. Electronically Signed   By: Edwin Cap M.D.   On: 06/27/2019 09:57   MS DIGITAL SCREENING TOMO BILATERAL  Result Date: 06/21/2019 CLINICAL DATA:  Screening. Baseline. EXAM: DIGITAL SCREENING BILATERAL MAMMOGRAM WITH TOMO AND CAD COMPARISON:  None. ACR Breast Density  Category c: The breast tissue is heterogeneously dense, which may obscure small masses. FINDINGS: In the left breast, a possible mass warrants further evaluation. This possible mass is seen within the upper LEFT breast, at middle depth, MLO slice 28, possible correlate within the slightly outer LEFT breast on the CC view. In the right breast, no findings suspicious for malignancy. Images were processed with CAD. IMPRESSION: Further evaluation is suggested for possible mass in the left breast. RECOMMENDATION: Diagnostic mammogram and possibly ultrasound of the left breast. (Code:FI-L-44M) The patient will be contacted regarding the findings, and additional imaging will be scheduled. BI-RADS CATEGORY  0: Incomplete. Need additional imaging evaluation and/or prior mammograms for comparison. Electronically Signed   By: Bary Richard M.D.   On: 06/21/2019 08:07      Pelvic/Bimanual Pap is not indicated today    Smoking History: Patient is a current smoker 1/2 pack per day and was referred to quit line.    Patient Navigation: Patient education provided. Access to services provided for patient through [facility] program. Stratus Reuel Boom [phone]) interpreter provided. No transportation provided   Colorectal Cancer Screening: Per patient has never had colonoscopy completed No complaints today.    Breast and Cervical Cancer Risk Assessment: Patient does not have family history of breast cancer, known genetic mutations, or radiation treatment to the chest before age 86. Patient does not have history of cervical dysplasia, immunocompromised, or DES exposure in-utero.  Risk Assessment   No risk assessment data     A: [facility] exam without pap smear No complaints today with benign exam. Diagnostic follow up of probable benign left breast mass.   P: Referred [facility] to the [facility] for a diagnostic mammogram. Appointment scheduled 07/21/22.  Ilda Basset  A, NP 07/17/2022 9:18 AM

## 2022-07-21 ENCOUNTER — Ambulatory Visit (HOSPITAL_COMMUNITY)
Admission: RE | Admit: 2022-07-21 | Discharge: 2022-07-21 | Disposition: A | Discharge status: Home / Self Care | Payer: Self-pay | Source: Ambulatory Visit | Attending: Obstetrics and Gynecology | Admitting: Obstetrics and Gynecology

## 2022-07-21 DIAGNOSIS — N6321 Unspecified lump in the left breast, upper outer quadrant: Secondary | ICD-10-CM

## 2022-08-07 ENCOUNTER — Other Ambulatory Visit (HOSPITAL_COMMUNITY): Payer: Self-pay | Admitting: *Deleted

## 2022-08-07 DIAGNOSIS — E041 Nontoxic single thyroid nodule: Secondary | ICD-10-CM

## 2022-08-14 ENCOUNTER — Ambulatory Visit (HOSPITAL_COMMUNITY)
Admission: RE | Admit: 2022-08-14 | Discharge: 2022-08-14 | Disposition: A | Discharge status: Home / Self Care | Payer: Self-pay | Source: Ambulatory Visit | Attending: *Deleted | Admitting: *Deleted

## 2022-08-14 DIAGNOSIS — E041 Nontoxic single thyroid nodule: Secondary | ICD-10-CM | POA: Insufficient documentation

## 2022-12-22 ENCOUNTER — Ambulatory Visit
Admission: EM | Admit: 2022-12-22 | Discharge: 2022-12-22 | Disposition: A | Discharge status: Home / Self Care | Payer: Self-pay | Attending: Family Medicine | Admitting: Family Medicine

## 2022-12-22 DIAGNOSIS — F411 Generalized anxiety disorder: Secondary | ICD-10-CM

## 2022-12-22 DIAGNOSIS — G4700 Insomnia, unspecified: Secondary | ICD-10-CM

## 2022-12-22 MED ORDER — FLUOXETINE HCL 10 MG PO TABS: 10.0000 mg | ORAL_TABLET | Freq: Every day | ORAL | 2 refills | Status: AC

## 2022-12-22 MED ORDER — ESZOPICLONE 3 MG PO TABS: 3.0000 mg | ORAL_TABLET | Freq: Every day | ORAL | 0 refills | Status: AC

## 2022-12-22 NOTE — ED Triage Notes (Signed)
Per pt family, she has been stressed x 1 month. She is having trouble sleeping that has worsen and loss of appetite x 1 week. Pt states she has some upper back/neck pressure. She states she feels tired

## 2022-12-23 NOTE — ED Provider Notes (Addendum)
[facility]   [id number] 12/22/22 Arrival Time: 1849  ASSESSMENT & PLAN:  1. Insomnia, unspecified type   2. Generalized anxiety disorder    Stressed importance of est care with PCP. Voices understanding. Agrees to trial of SSRI. Will use Lunesta sparingly. Hopefully, getting some sleep will help her mood. May f/u here in 3-4 weeks to see how she is doing on Prozac.  Meds ordered this encounter  Medications   Eszopiclone 3 MG TABS    Sig: Take 1 tablet (3 mg total) by mouth at bedtime. Take immediately before bedtime    Dispense:  30 tablet    Refill:  0   FLUoxetine (PROZAC) 10 MG tablet    Sig: Take 1 tablet (10 mg total) by mouth daily.    Dispense:  30 tablet    Refill:  2    Reviewed expectations re: course of current medical issues. Questions answered. Outlined signs and symptoms indicating need for more acute intervention. Patient verbalized understanding. After Visit Summary given.   SUBJECTIVE:  April Aguirre is a 42 y.o. female; per pt family, she has been stressed x 1 month. She is having trouble sleeping that has worsen and loss of appetite x 1 week. Pt states she has some upper back/neck pressure. She states she feels tired. Relates all of this to anxiety. Denies SI. No previous care sought for this.  Social History   Tobacco Use  Smoking Status Some Days   Types: Cigarettes  Smokeless Tobacco Never   Social History   Substance and Sexual Activity  Alcohol Use Yes   Comment: not now   Denies illicit drug use.  OBJECTIVE:  Vitals:   12/22/22 1915  BP: 132/83  Pulse: 72  Resp: 20  Temp: 98.5 F (36.9 C)  TempSrc: Oral  SpO2: 97%    General appearance: alert; appears NAD Eyes: PERRLA; EOMI; conjunctiva normal HENT: normocephalic; atraumatic Neck: supple Lungs: clear to auscultation bilaterally Heart: regular rate and rhythm Abdomen: soft, non-tender  Extremities: no edema; symmetrical with no gross  deformities Skin: warm and dry Neurologic: normal gait; normal symmetric reflexes Psychological: alert and cooperative; appropriate mood; normal affect   No Known Allergies  History reviewed. No pertinent past medical history. Social History   Socioeconomic History   Marital status: Married    Spouse name: Not on file   Number of children: 3   Years of education: Not on file   Highest education level: 9th grade  Occupational History   Not on file  Tobacco Use   Smoking status: Some Days    Types: Cigarettes   Smokeless tobacco: Never  Vaping Use   Vaping Use: Never used  Substance and Sexual Activity   Alcohol use: Yes    Comment: not now   Drug use: No   Sexual activity: Yes    Birth control/protection: None, Implant  Other Topics Concern   Not on file  Social History Narrative   Not on file   Social Determinants of Health   Financial Resource Strain: Not on file  Food Insecurity: No Food Insecurity (07/17/2022)   Hunger Vital Sign    Worried About Running Out of Food in the Last Year: Never true    Ran Out of Food in the Last Year: Never true  Transportation Needs: No Transportation Needs (07/17/2022)   PRAPARE - Administrator, Civil Service (Medical): No    Lack of Transportation (Non-Medical): No  Physical Activity: Not on file  Stress: Not on file  Social Connections: Not on file  Intimate Partner Violence: Not on file   Family History  Problem Relation Age of Onset   Diabetes Mother    Diabetes Brother    Diabetes Brother    Diabetes Brother    Past Surgical History:  Procedure Laterality Date   CESAREAN SECTION         Mardella Layman, MD 12/23/22 0454    Mardella Layman, MD 12/23/22 [phone]
